# Patient Record
Sex: Female | Born: 1940 | Race: White | Hispanic: No | Smoking: Never smoker
Health system: Southern US, Community
[De-identification: ages and names within clinical notes are randomized; demographics above are authoritative.]

## PROBLEM LIST (undated history)

## (undated) DIAGNOSIS — E7800 Pure hypercholesterolemia, unspecified: Secondary | ICD-10-CM

## (undated) DIAGNOSIS — E119 Type 2 diabetes mellitus without complications: Secondary | ICD-10-CM

## (undated) DIAGNOSIS — I10 Essential (primary) hypertension: Secondary | ICD-10-CM

## (undated) HISTORY — PX: ABDOMINAL HYSTERECTOMY: SHX81

---

## 1999-09-19 ENCOUNTER — Encounter: Payer: Self-pay | Admitting: Family Medicine

## 1999-09-19 ENCOUNTER — Encounter: Admission: RE | Admit: 1999-09-19 | Discharge: 1999-09-19 | Payer: Self-pay | Admitting: Family Medicine

## 1999-10-03 ENCOUNTER — Encounter: Admission: RE | Admit: 1999-10-03 | Discharge: 1999-10-03 | Payer: Self-pay | Admitting: Family Medicine

## 1999-10-03 ENCOUNTER — Encounter: Payer: Self-pay | Admitting: Family Medicine

## 2000-04-03 ENCOUNTER — Encounter: Payer: Self-pay | Admitting: Family Medicine

## 2000-04-03 ENCOUNTER — Encounter: Admission: RE | Admit: 2000-04-03 | Discharge: 2000-04-03 | Payer: Self-pay | Admitting: Family Medicine

## 2000-04-21 ENCOUNTER — Ambulatory Visit (HOSPITAL_COMMUNITY): Admission: RE | Admit: 2000-04-21 | Discharge: 2000-04-21 | Payer: Self-pay | Admitting: Family Medicine

## 2000-04-21 ENCOUNTER — Encounter: Payer: Self-pay | Admitting: Family Medicine

## 2000-07-29 ENCOUNTER — Encounter: Admission: RE | Admit: 2000-07-29 | Discharge: 2000-07-29 | Payer: Self-pay | Admitting: Family Medicine

## 2000-07-29 ENCOUNTER — Encounter: Payer: Self-pay | Admitting: Family Medicine

## 2000-09-23 ENCOUNTER — Ambulatory Visit (HOSPITAL_COMMUNITY): Admission: RE | Admit: 2000-09-23 | Discharge: 2000-09-23 | Payer: Self-pay | Admitting: Family Medicine

## 2000-09-23 ENCOUNTER — Encounter: Payer: Self-pay | Admitting: Family Medicine

## 2001-07-15 ENCOUNTER — Ambulatory Visit (HOSPITAL_COMMUNITY): Admission: RE | Admit: 2001-07-15 | Discharge: 2001-07-15 | Payer: Self-pay | Admitting: Gastroenterology

## 2001-07-15 ENCOUNTER — Encounter: Payer: Self-pay | Admitting: Gastroenterology

## 2001-08-03 ENCOUNTER — Encounter: Admission: RE | Admit: 2001-08-03 | Discharge: 2001-08-03 | Payer: Self-pay | Admitting: Family Medicine

## 2001-08-03 ENCOUNTER — Encounter: Payer: Self-pay | Admitting: Family Medicine

## 2005-11-17 ENCOUNTER — Encounter: Admission: RE | Admit: 2005-11-17 | Discharge: 2005-11-17 | Payer: Self-pay | Admitting: Family Medicine

## 2006-07-14 ENCOUNTER — Encounter: Admission: RE | Admit: 2006-07-14 | Discharge: 2006-07-14 | Payer: Self-pay | Admitting: Family Medicine

## 2016-05-06 ENCOUNTER — Other Ambulatory Visit: Payer: Self-pay | Admitting: Family Medicine

## 2016-05-06 DIAGNOSIS — R109 Unspecified abdominal pain: Secondary | ICD-10-CM

## 2016-05-16 ENCOUNTER — Inpatient Hospital Stay (HOSPITAL_COMMUNITY)
Admission: EM | Admit: 2016-05-16 | Discharge: 2016-05-23 | DRG: 824 | Disposition: A | Discharge status: Home / Self Care | Payer: Medicare Other | Attending: Family Medicine | Admitting: Family Medicine

## 2016-05-16 ENCOUNTER — Encounter (HOSPITAL_COMMUNITY): Payer: Self-pay

## 2016-05-16 ENCOUNTER — Ambulatory Visit
Admission: RE | Admit: 2016-05-16 | Discharge: 2016-05-16 | Disposition: A | Discharge status: Home / Self Care | Payer: Medicare Other | Source: Ambulatory Visit | Attending: Family Medicine | Admitting: Family Medicine

## 2016-05-16 DIAGNOSIS — N1330 Unspecified hydronephrosis: Secondary | ICD-10-CM | POA: Diagnosis present

## 2016-05-16 DIAGNOSIS — E119 Type 2 diabetes mellitus without complications: Secondary | ICD-10-CM

## 2016-05-16 DIAGNOSIS — Z85528 Personal history of other malignant neoplasm of kidney: Secondary | ICD-10-CM | POA: Diagnosis not present

## 2016-05-16 DIAGNOSIS — Z0189 Encounter for other specified special examinations: Secondary | ICD-10-CM | POA: Diagnosis not present

## 2016-05-16 DIAGNOSIS — E871 Hypo-osmolality and hyponatremia: Secondary | ICD-10-CM | POA: Diagnosis present

## 2016-05-16 DIAGNOSIS — N390 Urinary tract infection, site not specified: Secondary | ICD-10-CM | POA: Diagnosis present

## 2016-05-16 DIAGNOSIS — Z5111 Encounter for antineoplastic chemotherapy: Secondary | ICD-10-CM | POA: Diagnosis not present

## 2016-05-16 DIAGNOSIS — Z7984 Long term (current) use of oral hypoglycemic drugs: Secondary | ICD-10-CM

## 2016-05-16 DIAGNOSIS — R160 Hepatomegaly, not elsewhere classified: Secondary | ICD-10-CM | POA: Diagnosis present

## 2016-05-16 DIAGNOSIS — E11649 Type 2 diabetes mellitus with hypoglycemia without coma: Secondary | ICD-10-CM | POA: Diagnosis present

## 2016-05-16 DIAGNOSIS — N139 Obstructive and reflux uropathy, unspecified: Secondary | ICD-10-CM | POA: Diagnosis present

## 2016-05-16 DIAGNOSIS — C8333 Diffuse large B-cell lymphoma, intra-abdominal lymph nodes: Principal | ICD-10-CM | POA: Diagnosis present

## 2016-05-16 DIAGNOSIS — E785 Hyperlipidemia, unspecified: Secondary | ICD-10-CM | POA: Diagnosis present

## 2016-05-16 DIAGNOSIS — R109 Unspecified abdominal pain: Secondary | ICD-10-CM

## 2016-05-16 DIAGNOSIS — K7460 Unspecified cirrhosis of liver: Secondary | ICD-10-CM | POA: Diagnosis present

## 2016-05-16 DIAGNOSIS — R188 Other ascites: Secondary | ICD-10-CM | POA: Diagnosis present

## 2016-05-16 DIAGNOSIS — Z6834 Body mass index (BMI) 34.0-34.9, adult: Secondary | ICD-10-CM | POA: Diagnosis not present

## 2016-05-16 DIAGNOSIS — D649 Anemia, unspecified: Secondary | ICD-10-CM | POA: Diagnosis present

## 2016-05-16 DIAGNOSIS — Z7982 Long term (current) use of aspirin: Secondary | ICD-10-CM

## 2016-05-16 DIAGNOSIS — E7800 Pure hypercholesterolemia, unspecified: Secondary | ICD-10-CM | POA: Diagnosis present

## 2016-05-16 DIAGNOSIS — Z79899 Other long term (current) drug therapy: Secondary | ICD-10-CM

## 2016-05-16 DIAGNOSIS — I700 Atherosclerosis of aorta: Secondary | ICD-10-CM | POA: Diagnosis present

## 2016-05-16 DIAGNOSIS — R590 Localized enlarged lymph nodes: Secondary | ICD-10-CM | POA: Diagnosis present

## 2016-05-16 DIAGNOSIS — T68XXXA Hypothermia, initial encounter: Secondary | ICD-10-CM

## 2016-05-16 DIAGNOSIS — R591 Generalized enlarged lymph nodes: Secondary | ICD-10-CM

## 2016-05-16 DIAGNOSIS — N131 Hydronephrosis with ureteral stricture, not elsewhere classified: Secondary | ICD-10-CM | POA: Diagnosis present

## 2016-05-16 DIAGNOSIS — E860 Dehydration: Secondary | ICD-10-CM | POA: Diagnosis present

## 2016-05-16 DIAGNOSIS — N179 Acute kidney failure, unspecified: Secondary | ICD-10-CM | POA: Diagnosis present

## 2016-05-16 DIAGNOSIS — C8330 Diffuse large B-cell lymphoma, unspecified site: Secondary | ICD-10-CM | POA: Diagnosis not present

## 2016-05-16 DIAGNOSIS — K769 Liver disease, unspecified: Secondary | ICD-10-CM | POA: Diagnosis not present

## 2016-05-16 DIAGNOSIS — C787 Secondary malignant neoplasm of liver and intrahepatic bile duct: Secondary | ICD-10-CM

## 2016-05-16 DIAGNOSIS — R410 Disorientation, unspecified: Secondary | ICD-10-CM

## 2016-05-16 DIAGNOSIS — I10 Essential (primary) hypertension: Secondary | ICD-10-CM | POA: Diagnosis present

## 2016-05-16 DIAGNOSIS — R599 Enlarged lymph nodes, unspecified: Secondary | ICD-10-CM

## 2016-05-16 DIAGNOSIS — E872 Acidosis: Secondary | ICD-10-CM | POA: Diagnosis present

## 2016-05-16 DIAGNOSIS — E669 Obesity, unspecified: Secondary | ICD-10-CM | POA: Diagnosis present

## 2016-05-16 DIAGNOSIS — C8580 Other specified types of non-Hodgkin lymphoma, unspecified site: Secondary | ICD-10-CM

## 2016-05-16 DIAGNOSIS — R0602 Shortness of breath: Secondary | ICD-10-CM

## 2016-05-16 DIAGNOSIS — E162 Hypoglycemia, unspecified: Secondary | ICD-10-CM | POA: Diagnosis present

## 2016-05-16 DIAGNOSIS — B3749 Other urogenital candidiasis: Secondary | ICD-10-CM | POA: Insufficient documentation

## 2016-05-16 HISTORY — DX: Pure hypercholesterolemia, unspecified: E78.00

## 2016-05-16 HISTORY — DX: Type 2 diabetes mellitus without complications: E11.9

## 2016-05-16 HISTORY — DX: Essential (primary) hypertension: I10

## 2016-05-16 LAB — BASIC METABOLIC PANEL
Anion gap: 8 (ref 5–15)
BUN: 52 mg/dL — ABNORMAL HIGH (ref 6–20)
CALCIUM: 14.1 mg/dL — AB (ref 8.9–10.3)
CO2: 24 mmol/L (ref 22–32)
CREATININE: 1.71 mg/dL — AB (ref 0.44–1.00)
Chloride: 102 mmol/L (ref 101–111)
GFR, EST AFRICAN AMERICAN: 33 mL/min — AB (ref >60)
GFR, EST NON AFRICAN AMERICAN: 28 mL/min — AB (ref >60)
Glucose, Bld: 86 mg/dL (ref 65–99)
Potassium: 4.1 mmol/L (ref 3.5–5.1)
Sodium: 134 mmol/L — ABNORMAL LOW (ref 135–145)

## 2016-05-16 LAB — GLUCOSE, CAPILLARY: GLUCOSE-CAPILLARY: 104 mg/dL — AB (ref 65–99)

## 2016-05-16 LAB — COMPREHENSIVE METABOLIC PANEL
ALT: 15 U/L (ref 14–54)
AST: 34 U/L (ref 15–41)
Albumin: 3.6 g/dL (ref 3.5–5.0)
Alkaline Phosphatase: 67 U/L (ref 38–126)
Anion gap: 14 (ref 5–15)
BUN: 53 mg/dL — AB (ref 6–20)
CHLORIDE: 100 mmol/L — AB (ref 101–111)
CO2: 20 mmol/L — AB (ref 22–32)
CREATININE: 1.56 mg/dL — AB (ref 0.44–1.00)
Calcium: 14.5 mg/dL (ref 8.9–10.3)
GFR calc Af Amer: 36 mL/min — ABNORMAL LOW (ref >60)
GFR, EST NON AFRICAN AMERICAN: 31 mL/min — AB (ref >60)
Glucose, Bld: 34 mg/dL — CL (ref 65–99)
POTASSIUM: 3.9 mmol/L (ref 3.5–5.1)
SODIUM: 134 mmol/L — AB (ref 135–145)
Total Bilirubin: 0.9 mg/dL (ref 0.3–1.2)
Total Protein: 7.4 g/dL (ref 6.5–8.1)

## 2016-05-16 LAB — CBC WITH DIFFERENTIAL/PLATELET
BASOS ABS: 0 10*3/uL (ref 0.0–0.1)
BASOS PCT: 0 %
Eosinophils Absolute: 0.1 10*3/uL (ref 0.0–0.7)
Eosinophils Relative: 1 %
HEMATOCRIT: 38.2 % (ref 36.0–46.0)
HEMOGLOBIN: 12.6 g/dL (ref 12.0–15.0)
LYMPHS PCT: 15 %
Lymphs Abs: 1.5 10*3/uL (ref 0.7–4.0)
MCH: 27.7 pg (ref 26.0–34.0)
MCHC: 33 g/dL (ref 30.0–36.0)
MCV: 84 fL (ref 78.0–100.0)
MONOS PCT: 11 %
Monocytes Absolute: 1.1 10*3/uL — ABNORMAL HIGH (ref 0.1–1.0)
NEUTROS ABS: 7.3 10*3/uL (ref 1.7–7.7)
NEUTROS PCT: 73 %
Platelets: 365 10*3/uL (ref 150–400)
RBC: 4.55 MIL/uL (ref 3.87–5.11)
RDW: 14 % (ref 11.5–15.5)
WBC: 10.1 10*3/uL (ref 4.0–10.5)

## 2016-05-16 LAB — URINALYSIS, ROUTINE W REFLEX MICROSCOPIC
Bilirubin Urine: NEGATIVE
Glucose, UA: NEGATIVE mg/dL
Ketones, ur: NEGATIVE mg/dL
Nitrite: NEGATIVE
PROTEIN: NEGATIVE mg/dL
SPECIFIC GRAVITY, URINE: 1.046 — AB (ref 1.005–1.030)
pH: 5 (ref 5.0–8.0)

## 2016-05-16 LAB — CBG MONITORING, ED
GLUCOSE-CAPILLARY: 120 mg/dL — AB (ref 65–99)
GLUCOSE-CAPILLARY: 149 mg/dL — AB (ref 65–99)
Glucose-Capillary: 26 mg/dL — CL (ref 65–99)

## 2016-05-16 LAB — I-STAT CG4 LACTIC ACID, ED: LACTIC ACID, VENOUS: 2.51 mmol/L — AB (ref 0.5–1.9)

## 2016-05-16 LAB — URINE MICROSCOPIC-ADD ON

## 2016-05-16 LAB — AMMONIA: AMMONIA: 46 umol/L — AB (ref 9–35)

## 2016-05-16 MED ORDER — DEXTROSE 50 % IV SOLN
INTRAVENOUS | Status: AC
Start: 2016-05-16 — End: 2016-05-16
  Administered 2016-05-16: 50 mL
  Filled 2016-05-16: qty 50

## 2016-05-16 MED ORDER — IOPAMIDOL (ISOVUE-300) INJECTION 61%
INTRAVENOUS | Status: AC
  Filled 2016-05-16: qty 75

## 2016-05-16 MED ORDER — SODIUM CHLORIDE 0.9 % IV SOLN
90.0000 mg | Freq: Once | INTRAVENOUS | Status: AC
  Administered 2016-05-17: 90 mg via INTRAVENOUS
  Filled 2016-05-16: qty 10

## 2016-05-16 MED ORDER — ENSURE ENLIVE PO LIQD
237.0000 mL | Freq: Two times a day (BID) | ORAL | Status: DC
  Administered 2016-05-18 – 2016-05-20 (×3): 237 mL via ORAL

## 2016-05-16 MED ORDER — KCL IN DEXTROSE-NACL 20-5-0.9 MEQ/L-%-% IV SOLN
INTRAVENOUS | Status: DC
  Administered 2016-05-17 – 2016-05-18 (×4): via INTRAVENOUS
  Filled 2016-05-16 (×5): qty 1000

## 2016-05-16 MED ORDER — CEFTRIAXONE SODIUM 1 G IJ SOLR
1.0000 g | INTRAMUSCULAR | Status: DC
  Administered 2016-05-17 – 2016-05-22 (×6): 1 g via INTRAVENOUS
  Filled 2016-05-16 (×8): qty 10

## 2016-05-16 MED ORDER — ATENOLOL 50 MG PO TABS
100.0000 mg | ORAL_TABLET | Freq: Every evening | ORAL | Status: DC
  Administered 2016-05-16 – 2016-05-22 (×6): 100 mg via ORAL
  Filled 2016-05-16: qty 2
  Filled 2016-05-16: qty 4
  Filled 2016-05-16: qty 2
  Filled 2016-05-16 (×2): qty 4
  Filled 2016-05-16: qty 2

## 2016-05-16 MED ORDER — SODIUM CHLORIDE 0.9% FLUSH
3.0000 mL | Freq: Two times a day (BID) | INTRAVENOUS | Status: DC
  Administered 2016-05-17 – 2016-05-23 (×12): 3 mL via INTRAVENOUS

## 2016-05-16 MED ORDER — ONDANSETRON HCL 4 MG PO TABS: 4.0000 mg | ORAL_TABLET | Freq: Four times a day (QID) | ORAL | Status: DC | PRN

## 2016-05-16 MED ORDER — ZOLEDRONIC ACID 5 MG/100ML IV SOLN: 5.0000 mg | Freq: Once | INTRAVENOUS | Status: DC

## 2016-05-16 MED ORDER — CALCITONIN (SALMON) 200 UNIT/ML IJ SOLN
200.0000 [IU] | Freq: Once | INTRAMUSCULAR | Status: AC
  Administered 2016-05-16: 200 [IU] via INTRAMUSCULAR
  Filled 2016-05-16 (×2): qty 1

## 2016-05-16 MED ORDER — ASPIRIN EC 81 MG PO TBEC
81.0000 mg | DELAYED_RELEASE_TABLET | Freq: Every day | ORAL | Status: DC
Start: 2016-05-17 — End: 2016-05-23
  Administered 2016-05-17 – 2016-05-23 (×6): 81 mg via ORAL
  Filled 2016-05-16 (×7): qty 1

## 2016-05-16 MED ORDER — SODIUM CHLORIDE 0.9 % IV BOLUS (SEPSIS)
1000.0000 mL | Freq: Once | INTRAVENOUS | Status: AC
  Administered 2016-05-17: 1000 mL via INTRAVENOUS

## 2016-05-16 MED ORDER — ONDANSETRON HCL 4 MG/2ML IJ SOLN
4.0000 mg | Freq: Four times a day (QID) | INTRAMUSCULAR | Status: DC | PRN
  Administered 2016-05-16 – 2016-05-23 (×3): 4 mg via INTRAVENOUS
  Filled 2016-05-16 (×4): qty 2

## 2016-05-16 MED ORDER — DEXTROSE 5 % IV SOLN
1.0000 g | Freq: Once | INTRAVENOUS | Status: AC
  Administered 2016-05-16: 1 g via INTRAVENOUS
  Filled 2016-05-16: qty 10

## 2016-05-16 MED ORDER — POTASSIUM CHLORIDE IN NACL 20-0.9 MEQ/L-% IV SOLN: INTRAVENOUS | Status: DC

## 2016-05-16 MED ORDER — POTASSIUM CL IN DEXTROSE 5% 20 MEQ/L IV SOLN
20.0000 meq | INTRAVENOUS | Status: DC
  Filled 2016-05-16: qty 1000

## 2016-05-16 MED ORDER — FUROSEMIDE 10 MG/ML IJ SOLN
20.0000 mg | Freq: Once | INTRAMUSCULAR | Status: AC
  Administered 2016-05-17: 20 mg via INTRAVENOUS
  Filled 2016-05-16: qty 2

## 2016-05-16 MED ORDER — HEPARIN SODIUM (PORCINE) 5000 UNIT/ML IJ SOLN
5000.0000 [IU] | Freq: Three times a day (TID) | INTRAMUSCULAR | Status: DC
  Administered 2016-05-16 – 2016-05-18 (×6): 5000 [IU] via SUBCUTANEOUS
  Filled 2016-05-16 (×4): qty 1

## 2016-05-16 MED ORDER — IOPAMIDOL (ISOVUE-300) INJECTION 61%
100.0000 mL | Freq: Once | INTRAVENOUS | Status: AC | PRN
  Administered 2016-05-16: 100 mL via INTRAVENOUS

## 2016-05-16 NOTE — ED Provider Notes (Signed)
[facility] DEPT Provider Note   CSN: [id number] Arrival date & time: 05/16/16  1434     History   Chief Complaint Chief Complaint  Patient presents with  . Flank Pain    HPI Grace Patel is a 75 y.o. female.  Patient presents to the ED with a chief complaint confusion.  She is brought in by her daughter, who states that she has been acting confused today.  She also reports that she has had lower abdominal pain, nausea, vomiting, diarrhea, and weakness for the past 10 days. She reports that she has not been eating or drinking well. She is having difficulty keeping fluids down. She reports being seen by her primary care doctor and had a CT scan yesterday which showed extensive adenopathy worrisome for metastatic disease, ascites, and obstructive uropathy.  There are no other associated symptoms.  She denies fever, but does endorse chills.  She denies any chest pain, SOB, or cough.  There are no modifying factors.   The history is provided by the patient. No language interpreter was used.    Past Medical History:  Diagnosis Date  . Diabetes mellitus without complication ([place])   . Hypercholesteremia   . Hypertension     There are no active problems to display for this patient.   Past Surgical History:  Procedure Laterality Date  . ABDOMINAL HYSTERECTOMY      OB History    No data available       Home Medications    Prior to Admission medications   Not on File    Family History No family history on file.  Social History Social History  Substance Use Topics  . Smoking status: Never Smoker  . Smokeless tobacco: Never Used  . Alcohol use No     Allergies   Review of patient's allergies indicates no known allergies.   Review of Systems Review of Systems  Unable to perform ROS: Mental status change     Physical Exam Updated Vital Signs BP (!) 147/52   Pulse 73   Temp (!) 95.1 F (35.1 C) (Rectal)   Resp 23   SpO2 100%   Physical Exam    Constitutional: She is oriented to person, place, and time. She appears well-developed and well-nourished.  HENT:  Head: Normocephalic and atraumatic.  Eyes: Conjunctivae and EOM are normal. Pupils are equal, round, and reactive to light.  Neck: Normal range of motion. Neck supple.  Cardiovascular: Normal rate and regular rhythm.  Exam reveals no gallop and no friction rub.   No murmur heard. Pulmonary/Chest: Effort normal and breath sounds normal. No respiratory distress. She has no wheezes. She has no rales. She exhibits no tenderness.  Abdominal: Soft. Bowel sounds are normal. She exhibits no distension and no mass. There is no tenderness. There is no rebound and no guarding.  Musculoskeletal: Normal range of motion. She exhibits no edema or tenderness.  Neurological: She is alert and oriented to person, place, and time.  Skin: Skin is warm and dry.  Psychiatric: She has a normal mood and affect. Her behavior is normal. Judgment and thought content normal.  Nursing note and vitals reviewed.    ED Treatments / Results  Labs (all labs ordered are listed, but only abnormal results are displayed) Labs Reviewed  COMPREHENSIVE METABOLIC PANEL - Abnormal; Notable for the following:       Result Value   Sodium 134 (*)    Chloride 100 (*)    CO2 20 (*)  Glucose, Bld 34 (*)    BUN 53 (*)    Creatinine, Ser 1.56 (*)    Calcium 14.5 (*)    GFR calc non Af Amer 31 (*)    GFR calc Af Amer 36 (*)    All other components within normal limits  CBC WITH DIFFERENTIAL/PLATELET - Abnormal; Notable for the following:    Monocytes Absolute 1.1 (*)    All other components within normal limits  I-STAT CG4 LACTIC ACID, ED - Abnormal; Notable for the following:    Lactic Acid, Venous 2.51 (*)    All other components within normal limits  CBG MONITORING, ED - Abnormal; Notable for the following:    Glucose-Capillary 26 (*)    All other components within normal limits  URINE CULTURE  CULTURE,  BLOOD (ROUTINE X 2)  CULTURE, BLOOD (ROUTINE X 2)  URINALYSIS, ROUTINE W REFLEX MICROSCOPIC (NOT AT [facility])  CBG MONITORING, ED  CBG MONITORING, ED    EKG  EKG Interpretation None       Radiology Ct Abdomen W Contrast  Result Date: 05/16/2016 CLINICAL DATA:  Nausea and vomiting for several weeks. EXAM: CT ABDOMEN WITH CONTRAST TECHNIQUE: Multidetector CT imaging of the abdomen was performed using the standard protocol following bolus administration of intravenous contrast. CONTRAST:  145mL ISOVUE-300 IOPAMIDOL (ISOVUE-300) INJECTION 61% COMPARISON:  None. FINDINGS: Lower chest: There is a small left pleural effusion identified. The lung bases appear clear. Hepatobiliary: The liver appears cirrhotic. The contour the liver is diffusely nodular and there is hypertrophy of the caudate lobe lateral segment of left lobe. Indeterminate structure in the right lobe of liver measures 3.5 x 1.6 cm and 46 HU. The gallbladder appears normal. No biliary dilatation. Pancreas: No inflammation or mass identified. Spleen: The spleen appears normal. The spleen measures 10 cm in length. Adrenals/Urinary Tract: Normal appearance of the adrenal glands. The kidneys half an irregular contour bilaterally suggestive of chronic scarring. There is left-sided hydronephrosis. Stomach/Bowel: The stomach appears normal. The small bowel loops have a normal course and caliber. No pathologic dilatation of the colon. Vascular/Lymphatic: Aortic atherosclerosis is noted. No aneurysm. Extensive retroperitoneal adenopathy is identified. Nodal mass within the retroperitoneum measures 9.3 x 5.0 cm, image 35 of series 3. There are numerous soft tissue nodules identified within the left-sided perinephric space. Other: Mild soft tissue nodularity within the perineum is identified along with ascites. In the setting of cirrhosis the ascites is nonspecific. Musculoskeletal: No aggressive lytic or sclerotic bone lesions. IMPRESSION: 1. Extensive  adenopathy is identified within the upper abdomen which is worrisome for either a metastatic adenopathy versus lymphoproliferative disorder such as lymphoma. Further assessment with PET-CT and tissue sampling is recommended. 2. There is evidence of soft tissue nodularity within the peritoneum as well as within the left perinephric fat. 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 Ascites 4. Left-sided obstructive uropathy. Favor obstruction of the left ureter secondary to retroperitoneal adenopathy. 5. Morphologic features of the liver compatible with cirrhosis. 6. Indeterminate intermediate attenuating structure within right lobe of liver. A more definitive assessment of this structure could be obtained with a contrast enhanced MR of the liver. 7. Aortic atherosclerosis.  No aneurysm. Electronically Signed   By: Kerby Moors M.D.   On: 05/16/2016 13:23    Procedures Procedures (including critical care time)  Medications Ordered in ED Medications  cefTRIAXone (ROCEPHIN) 1 g in dextrose 5 % 50 mL IVPB (not administered)  dextrose 50 % solution (50 mLs  Given 05/16/16 1608)  Initial Impression / Assessment and Plan / ED Course  I have reviewed the triage vital signs and the nursing notes.  Pertinent labs & imaging results that were available during my care of the patient were reviewed by me and considered in my medical decision making (see chart for details).  Clinical Course    Patient brought to the emergency department by her daughter with chief complaint of confusion. Patient had a CT scan performed yesterday which showed extensive abdominal adenopathy. Pelvis showed a ascites and obstructive uropathy. Patient noted to be quite hypoglycemic today to 26, she was alert, and one amp of D50 was given which corrected the glucose to the 120s. The glucose was rechecked several times, and she has not had any further episodes of hypoglycemia. Laboratory workup is also remarkable for  hypercalcemia at 14.5. She does not have leukocytosis. She was moderately hypothermic on arrival today, but this has resolved after being treated with the [facility].  Patient seen by and discussed with Dr. Roderic Palau, who agrees with plan for admission to medicine.  Appreciate Dr. Olevia Bowens for admitting the patient.  Final Clinical Impressions(s) / ED Diagnoses   Final diagnoses:  Hypercalcemia  Confusion  Hypoglycemia    New Prescriptions New Prescriptions   No medications on file     Montine Circle, Hershal Coria 05/16/16 Curly Rim    Milton Ferguson, MD 05/17/16 1226

## 2016-05-16 NOTE — ED Notes (Signed)
Report attempted x 1

## 2016-05-16 NOTE — ED Notes (Signed)
MD Ortiz at bedside 

## 2016-05-16 NOTE — ED Triage Notes (Signed)
Pt presents from PCP office following CT scan today. Sent here for evaluation of L kidney blockage and need for stent. Pt. States flank pain ongoing x 2-3 weeks, seen by PCP approx 10 days ago and scheduled for CT today. Pt. Family reports frequent diarrhea and vomiting.

## 2016-05-16 NOTE — H&P (Signed)
History and Physical    TALAYEH NESTLE [id number] DOB: October 18, 1940 DOA: 05/16/2016  PCP: No primary care provider on file.   Patient coming from: Referred by her PCP  Chief Complaint: Nausea and vomiting.  HPI: Grace Patel is a 75 y.o. female with medical history significant of type 2 diabetes, hyperlipidemia, hypertension who comes to the ED referred by her PCP due to several weeks of nausea/vomiting, flank pain and ct scan abdomen/pelvis showing left hydronephrosis, multiple adenopathies and liver lesion suspicious for malignancy.   Per patient, she has been having nausea, emesis, diarrhea fatigue and malaise in her.  She denies fever, chills, but complains of night sweats, the past few days. She denies abdominal pain, melena or hematochezia. She denies GU symptoms. She denies chest pain, palpitations, dyspnea, pitting edema of the lower extremities.   In the ER, the patient was initially hypoglycemic, which has resolved with dextrose IV. She was initially hypothermic, but her temperature is normal now. She is in no acute distress.   ED Course: The patient received IV dextrose, IV antibiotics and warming blanket reporting relief. Workup shows mildly abnormal urinalysis,  lactic acid of 2.51 mmol/L, hypercalcemia of 14.5 mg/dL, BUN 53 mg/dL, creatinine 1.56 mg/dL. Normal white blood cell count and hemoglobin.  Review of Systems: As per HPI otherwise 10 point review of systems negative.     Past Medical History:  Diagnosis Date  . Diabetes mellitus without complication ([place])   . Hypercholesteremia   . Hypertension     Past Surgical History:  Procedure Laterality Date  . ABDOMINAL HYSTERECTOMY       reports that she has never smoked. She has never used smokeless tobacco. She reports that she does not drink alcohol or use drugs.  Allergies  Allergen Reactions  . Codeine     Makes me sick     Family History  Problem Relation Age of Onset  . Heart attack Father   .  Heart disease Sister   . Lung disease Brother     Asbestosis  . Heart attack Brother      Prior to Admission medications   Medication Sig Start Date End Date Taking? Authorizing Provider  aspirin EC 81 MG tablet Take 81 mg by mouth daily.   Yes Historical Provider, MD  atenolol (TENORMIN) 100 MG tablet Take 100 mg by mouth every evening.   Yes Historical Provider, MD  glimepiride (AMARYL) 4 MG tablet Take 4 mg by mouth daily with breakfast.   Yes Historical Provider, MD  metFORMIN (GLUCOPHAGE) 500 MG tablet Take 1,000 mg by mouth 2 (two) times daily with a meal.   Yes Historical Provider, MD  pioglitazone (ACTOS) 30 MG tablet Take 30 mg by mouth daily.   Yes Historical Provider, MD  rosuvastatin (CRESTOR) 20 MG tablet Take 10 mg by mouth See admin instructions. Take on Mondays Wednesday and Friday mornings   Yes Historical Provider, MD  valsartan-hydrochlorothiazide (DIOVAN-HCT) 320-25 MG tablet Take 1 tablet by mouth daily.   Yes Historical Provider, MD    Physical Exam: Vitals:   05/16/16 1935 05/16/16 1945 05/16/16 2015 05/16/16 2124  BP:  (!) 106/54 (!) 123/53 (!) 126/57  Pulse:  72 75 79  Resp:  22  20  Temp: 98.3 F (36.8 C)   97.5 F (36.4 C)  TempSrc: Rectal   Oral  SpO2:  98% 98% 97%  Weight:    79.5 kg (175 lb 4.3 oz)  Height:    4' 11.5" (  1.511 m)      Constitutional: Looks pale, ill  Vitals:   05/16/16 1935 05/16/16 1945 05/16/16 2015 05/16/16 2124  BP:  (!) 106/54 (!) 123/53 (!) 126/57  Pulse:  72 75 79  Resp:  22  20  Temp: 98.3 F (36.8 C)   97.5 F (36.4 C)  TempSrc: Rectal   Oral  SpO2:  98% 98% 97%  Weight:    79.5 kg (175 lb 4.3 oz)  Height:    4' 11.5" (1.511 m)   Eyes: PERRL, lids and conjunctivae normal ENMT: Mucous membranes are dry. Posterior pharynx clear of any exudate or lesions.  Neck: normal, supple, no masses, no thyromegaly Respiratory: clear to auscultation bilaterally, no wheezing, no crackles. Normal respiratory effort. No  accessory muscle use.  Cardiovascular: Regular rate and rhythm, no murmurs / rubs / gallops. No extremity edema. 2+ pedal pulses. No carotid bruits.  Abdomen: Bowel sounds positive. Soft, no tenderness, no masses palpated. No hepatosplenomegaly.  No CVA tenderness at this time. Musculoskeletal: no clubbing / cyanosis. No joint deformity upper and lower extremities. Good ROM, no contractures. Normal muscle tone.  Skin: erythematous rash just below right breast. Neurologic: CN 2-12 grossly intact. Sensation intact, DTR normal. Strength 5/5 in all 4.  Psychiatric: Normal judgment and insight. Alert and oriented x 4. Normal mood.     Labs on Admission: I have personally reviewed following labs and imaging studies  CBC:  Recent Labs Lab 05/16/16 1446  WBC 10.1  NEUTROABS 7.3  HGB 12.6  HCT 38.2  MCV 84.0  PLT 99991111   Basic Metabolic Panel:  Recent Labs Lab 05/16/16 1446  NA 134*  K 3.9  CL 100*  CO2 20*  GLUCOSE 34*  BUN 53*  CREATININE 1.56*  CALCIUM 14.5*   GFR: Estimated Creatinine Clearance: 28.7 mL/min (by C-G formula based on SCr of 1.56 mg/dL). Liver Function Tests:  Recent Labs Lab 05/16/16 1446  AST 34  ALT 15  ALKPHOS 67  BILITOT 0.9  PROT 7.4  ALBUMIN 3.6     Recent Labs Lab 05/16/16 1740  AMMONIA 46*    CBG:  Recent Labs Lab 05/16/16 1558 05/16/16 1642 05/16/16 1742 05/16/16 2122  GLUCAP 26* 120* 149* 104*    Urine analysis:    Component Value Date/Time   COLORURINE YELLOW 05/16/2016 [place] 05/16/2016 1905   LABSPEC 1.046 (H) 05/16/2016 1905   PHURINE 5.0 05/16/2016 1905   GLUCOSEU NEGATIVE 05/16/2016 1905   HGBUR MODERATE (A) 05/16/2016 1905   BILIRUBINUR NEGATIVE 05/16/2016 1905   KETONESUR NEGATIVE 05/16/2016 1905   PROTEINUR NEGATIVE 05/16/2016 1905   NITRITE NEGATIVE 05/16/2016 1905   LEUKOCYTESUR SMALL (A) 05/16/2016 1905     Radiological Exams on Admission: Ct Abdomen W Contrast  Result Date:  05/16/2016 CLINICAL DATA:  Nausea and vomiting for several weeks. EXAM: CT ABDOMEN WITH CONTRAST TECHNIQUE: Multidetector CT imaging of the abdomen was performed using the standard protocol following bolus administration of intravenous contrast. CONTRAST:  127mL ISOVUE-300 IOPAMIDOL (ISOVUE-300) INJECTION 61% COMPARISON:  None. FINDINGS: Lower chest: There is a small left pleural effusion identified. The lung bases appear clear. Hepatobiliary: The liver appears cirrhotic. The contour the liver is diffusely nodular and there is hypertrophy of the caudate lobe lateral segment of left lobe. Indeterminate structure in the right lobe of liver measures 3.5 x 1.6 cm and 46 HU. The gallbladder appears normal. No biliary dilatation. Pancreas: No inflammation or mass identified. Spleen: The spleen appears normal.  The spleen measures 10 cm in length. Adrenals/Urinary Tract: Normal appearance of the adrenal glands. The kidneys half an irregular contour bilaterally suggestive of chronic scarring. There is left-sided hydronephrosis. Stomach/Bowel: The stomach appears normal. The small bowel loops have a normal course and caliber. No pathologic dilatation of the colon. Vascular/Lymphatic: Aortic atherosclerosis is noted. No aneurysm. Extensive retroperitoneal adenopathy is identified. Nodal mass within the retroperitoneum measures 9.3 x 5.0 cm, image 35 of series 3. There are numerous soft tissue nodules identified within the left-sided perinephric space. Other: Mild soft tissue nodularity within the perineum is identified along with ascites. In the setting of cirrhosis the ascites is nonspecific. Musculoskeletal: No aggressive lytic or sclerotic bone lesions. IMPRESSION: 1. Extensive adenopathy is identified within the upper abdomen which is worrisome for either a metastatic adenopathy versus lymphoproliferative disorder such as lymphoma. Further assessment with PET-CT and tissue sampling is recommended. 2. There is evidence of  soft tissue nodularity within the peritoneum as well as within the left perinephric fat. 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 Ascites 4. Left-sided obstructive uropathy. Favor obstruction of the left ureter secondary to retroperitoneal adenopathy. 5. Morphologic features of the liver compatible with cirrhosis. 6. Indeterminate intermediate attenuating structure within right lobe of liver. A more definitive assessment of this structure could be obtained with a contrast enhanced MR of the liver. 7. Aortic atherosclerosis.  No aneurysm. Electronically Signed   By: Kerby Moors M.D.   On: 05/16/2016 13:23    EKG: Independently reviewed. Vent. rate 69 BPM PR interval * ms QRS duration 77 ms QT/QTc 510/547 ms P-R-T axes 19 47 6 Sinus rhythm Abnormal R-wave progression, early transition Borderline T abnormalities, diffuse leads Prolonged QT interval  Assessment/Plan Principal Problem:   Hypercalcemia Admit to telemetry/inpatient. Normal saline 1 L bolus 1. Continue normal saline infusion. Furosemide 20 mg IVP 1. Pamidronate 90 mg IVPB 1. Calcitonin 200 units IVPB 1 Follow-up calcium level.  Active Problems:   Type 2 diabetes mellitus (HCC)   Hypoglycemia Carbohydrate modified diet. Hold oral hypoglycemic medications for now. CBG monitoring 4 times a day before meals and at bedtime. CBG monitoring when necessary. Dextrose as needed.    Hypothermia Resolved with warming blankets. Monitor temperature closely.    Liver mass, right lobe whole body PET scan ordered. Discussed with interventional radiology who will evaluate the patient tomorrow. Procedure will be likely done on Monday.    Acute unilateral obstructive uropathy   Hydronephrosis of left kidney Discussed with urology who will evaluate the patient in the morning.    UTI (urinary tract infection) Afebrile with no significant GU symptoms. Continue ceftriaxone 1 g every 24 hours. Follow-up urine  culture and sensitivity.    Hyponatremia Likely due to GI losses. Continue normal saline infusion.  Follow-up sodium level. Check CT scan of chest given hypocalcemia and hyponatremia.   Hyperlipidemia Will hold Crestor for now given generalized weakness. Monitor LFTs periodically.      Hypertension Hold valsartan HCTZ due to acidemia and hypercalcemia. Continue atenolol 100 mg by mouth daily. Monitor blood pressure periodically.    DVT prophylaxis: Heparin SQ Code Status: Full code. Family Communication: her two daughters and grandson were present in the ED room. Disposition Plan: Admit for hypercalcemia treatment, urology and IR evaluation. Consults called: Interventional radiology Arne Cleveland, M.D.), urology Baruch Gouty, MD) Admission status: Inpatient/telemetry   Reubin Milan MD [facility] Pager [phone].  If 7PM-7AM, please contact night-coverage [email or web address] Password [facility]  05/16/2016, 9:25 PM

## 2016-05-16 NOTE — ED Notes (Signed)
Patient is stable and able to taken to 6E at this time.  Temperature is stable and glucose is within normal.  Patient is A&Ox4.  Patient will be taken via telemetry to [place] by EMT

## 2016-05-17 ENCOUNTER — Inpatient Hospital Stay (HOSPITAL_COMMUNITY): Payer: Medicare Other

## 2016-05-17 DIAGNOSIS — D649 Anemia, unspecified: Secondary | ICD-10-CM

## 2016-05-17 DIAGNOSIS — K769 Liver disease, unspecified: Secondary | ICD-10-CM

## 2016-05-17 DIAGNOSIS — R599 Enlarged lymph nodes, unspecified: Secondary | ICD-10-CM

## 2016-05-17 DIAGNOSIS — E119 Type 2 diabetes mellitus without complications: Secondary | ICD-10-CM

## 2016-05-17 DIAGNOSIS — Z85528 Personal history of other malignant neoplasm of kidney: Secondary | ICD-10-CM

## 2016-05-17 LAB — CBC WITH DIFFERENTIAL/PLATELET
BASOS PCT: 0 %
Basophils Absolute: 0 10*3/uL (ref 0.0–0.1)
EOS ABS: 0.1 10*3/uL (ref 0.0–0.7)
EOS PCT: 1 %
HCT: 32.7 % — ABNORMAL LOW (ref 36.0–46.0)
HEMOGLOBIN: 10.4 g/dL — AB (ref 12.0–15.0)
LYMPHS ABS: 0.6 10*3/uL — AB (ref 0.7–4.0)
Lymphocytes Relative: 10 %
MCH: 26.9 pg (ref 26.0–34.0)
MCHC: 31.8 g/dL (ref 30.0–36.0)
MCV: 84.5 fL (ref 78.0–100.0)
Monocytes Absolute: 0.8 10*3/uL (ref 0.1–1.0)
Monocytes Relative: 13 %
NEUTROS PCT: 76 %
Neutro Abs: 4.2 10*3/uL (ref 1.7–7.7)
PLATELETS: 231 10*3/uL (ref 150–400)
RBC: 3.87 MIL/uL (ref 3.87–5.11)
RDW: 14.2 % (ref 11.5–15.5)
WBC: 5.6 10*3/uL (ref 4.0–10.5)

## 2016-05-17 LAB — COMPREHENSIVE METABOLIC PANEL
ALBUMIN: 2.7 g/dL — AB (ref 3.5–5.0)
ALT: 13 U/L — AB (ref 14–54)
ANION GAP: 8 (ref 5–15)
AST: 43 U/L — ABNORMAL HIGH (ref 15–41)
Alkaline Phosphatase: 54 U/L (ref 38–126)
BUN: 42 mg/dL — ABNORMAL HIGH (ref 6–20)
CHLORIDE: 105 mmol/L (ref 101–111)
CO2: 25 mmol/L (ref 22–32)
Calcium: 12.3 mg/dL — ABNORMAL HIGH (ref 8.9–10.3)
Creatinine, Ser: 1.58 mg/dL — ABNORMAL HIGH (ref 0.44–1.00)
GFR calc non Af Amer: 31 mL/min — ABNORMAL LOW (ref >60)
GFR, EST AFRICAN AMERICAN: 36 mL/min — AB (ref >60)
GLUCOSE: 134 mg/dL — AB (ref 65–99)
Potassium: 4.3 mmol/L (ref 3.5–5.1)
SODIUM: 138 mmol/L (ref 135–145)
Total Bilirubin: 0.5 mg/dL (ref 0.3–1.2)
Total Protein: 6 g/dL — ABNORMAL LOW (ref 6.5–8.1)

## 2016-05-17 LAB — LACTATE DEHYDROGENASE: LDH: 146 U/L (ref 98–192)

## 2016-05-17 LAB — GLUCOSE, CAPILLARY
GLUCOSE-CAPILLARY: 139 mg/dL — AB (ref 65–99)
Glucose-Capillary: 203 mg/dL — ABNORMAL HIGH (ref 65–99)
Glucose-Capillary: 125 mg/dL — ABNORMAL HIGH (ref 65–99)
Glucose-Capillary: 184 mg/dL — ABNORMAL HIGH (ref 65–99)

## 2016-05-17 LAB — LACTIC ACID, PLASMA
LACTIC ACID, VENOUS: 1.8 mmol/L (ref 0.5–1.9)
Lactic Acid, Venous: 3.2 mmol/L (ref 0.5–1.9)

## 2016-05-17 LAB — PROTIME-INR
INR: 1.3
PROTHROMBIN TIME: 16.3 s — AB (ref 11.4–15.2)

## 2016-05-17 MED ORDER — FUROSEMIDE 40 MG PO TABS
40.0000 mg | ORAL_TABLET | Freq: Every day | ORAL | Status: DC
  Filled 2016-05-17: qty 1

## 2016-05-17 MED ORDER — INSULIN ASPART 100 UNIT/ML ~~LOC~~ SOLN
0.0000 [IU] | Freq: Three times a day (TID) | SUBCUTANEOUS | Status: DC
  Administered 2016-05-19 – 2016-05-23 (×3): 2 [IU] via SUBCUTANEOUS

## 2016-05-17 MED ORDER — ROSUVASTATIN CALCIUM 10 MG PO TABS
10.0000 mg | ORAL_TABLET | ORAL | Status: DC
  Administered 2016-05-21 – 2016-05-23 (×2): 10 mg via ORAL
  Filled 2016-05-17 (×2): qty 1

## 2016-05-17 MED ORDER — FUROSEMIDE 10 MG/ML IJ SOLN
40.0000 mg | Freq: Once | INTRAMUSCULAR | Status: AC
  Administered 2016-05-17: 40 mg via INTRAVENOUS
  Filled 2016-05-17: qty 4

## 2016-05-17 MED ORDER — FUROSEMIDE 10 MG/ML IJ SOLN
20.0000 mg | Freq: Once | INTRAMUSCULAR | Status: AC
  Administered 2016-05-17: 20 mg via INTRAVENOUS
  Filled 2016-05-17: qty 2

## 2016-05-17 MED ORDER — SODIUM CHLORIDE 0.9 % IV BOLUS (SEPSIS)
1000.0000 mL | Freq: Once | INTRAVENOUS | Status: AC
  Administered 2016-05-17: 1000 mL via INTRAVENOUS

## 2016-05-17 MED ORDER — LORAZEPAM 2 MG/ML IJ SOLN: 1.0000 mg | Freq: Once | INTRAMUSCULAR | Status: DC | PRN

## 2016-05-17 MED ORDER — INSULIN ASPART 100 UNIT/ML ~~LOC~~ SOLN: 0.0000 [IU] | Freq: Every day | SUBCUTANEOUS | Status: DC

## 2016-05-17 NOTE — Progress Notes (Signed)
New Admission Note:   Arrival Method: Via stretcher from ED Mental Orientation:  A & O x 4 Telemetry: Placed on Tele Box [phone] Assessment: Completed Skin:  MSAD to sacrum IV: Pain: Denies Tubes:  None Safety Measures: Safety Fall Prevention Plan has been given, discussed and signed Admission: Completed [facility] Orientation: Patient has been orientated to the room, unit and staff.  Family:  From home with daughter and grandson  Cell phone at bedside.  She is aware of [facility]'s valuables policy.  Orders have been reviewed and implemented. Will continue to monitor the patient. Call light has been placed within reach and bed alarm has been activated.   Earleen Reaper RN- [place], [place] Phone number: [phone]

## 2016-05-17 NOTE — Progress Notes (Signed)
[facility] made aware of Lactic Acid of 3.2 and Calcium of 14.4.  No new orders received.  Will continue to monitor patient.  [employer] RN-BC, WTA.

## 2016-05-17 NOTE — Progress Notes (Signed)
IR request received for biopsy Imaging reviewed by Dr. Vernard Gambles Agree with MRI liver, but will likely favor CT guided biopsy of retroperitoneal adenopathy Plan for procedure Monday  Ascencion Dike PA-C Interventional Radiology 05/17/2016 11:22 AM

## 2016-05-17 NOTE — Progress Notes (Addendum)
PROGRESS NOTE                                                                                                                                                                                                             Patient Demographics:    Grace Patel, is a 75 y.o. female, DOB - 04-Oct-1940, [id number]  Admit date - 05/16/2016   Admitting Physician Reubin Milan, MD  Outpatient Primary MD for the patient is No primary care provider on file.  LOS - 1  Chief Complaint  Patient presents with  . Flank Pain       Brief Narrative     Grace Patel is a 75 y.o. female with medical history significant of type 2 diabetes, hyperlipidemia, hypertension who comes to the ED referred by her PCP due to several weeks of nausea/vomiting, flank pain and ct scan abdomen/pelvis showing left hydronephrosis, multiple adenopathies and liver lesion suspicious for malignancy.   Per patient, she has been having nausea, emesis, diarrhea fatigue and malaise in her.  She denies fever, chills, but complains of night sweats, the past few days. She denies abdominal pain, melena or hematochezia. She denies GU symptoms. She denies chest pain, palpitations, dyspnea, pitting edema of the lower extremities.   In the ER, the patient was initially hypoglycemic, which has resolved with dextrose IV. She was initially hypothermic, but her temperature is normal now. She is in no acute distress.   ED Course: The patient received IV dextrose, IV antibiotics and warming blanket reporting relief. Workup shows mildly abnormal urinalysis,  lactic acid of 2.51 mmol/L, hypercalcemia of 14.5 mg/dL, BUN 53 mg/dL, creatinine 1.56 mg/dL. Normal white blood cell count and hemoglobin.   Subjective:    Grace Patel today has, No headache, No chest pain, No abdominal pain - No Nausea, No new weakness tingling or numbness, No Cough - SOB.     Assessment  &  Plan :     1.Severe hypercalcemia induced nausea vomiting, dehydration, acute renal failure and weakness. Suspicious for underlying lymphoproliferative disorder based on CT scan findings, hypercalcemia treatment underway with IV fluids, Lasix, calcitonin and pamidronate which was given upon admission. We'll continue to hydrate and diurese. Monitor calcium levels which are improving. Continue supportive care.  2. Abdominal liver neuropathy, possible liver mass, elevated calcium. Suspicious for lymphoproliferative disorder. Ordered  LDH and CEA levels, peripheral smear, have requested oncology to evaluate, CT chest ordered, liver MRI to see if she has a mass which can be biopsied. We will order peripheral smear as well.  3. Left-sided hydronephrosis, likely due to abdominal adenopathy. Currently renal function is stable, urology has been consulted. Will monitor.  4. Hypertension hypertension. Continue home hospital blocker. ACE inhibitor and diuretic due to renal failure.  5. ARF due to combination of #1, #2 and #3. Hydrate and monitor, avoid nephrotoxins, renal function improving.   6. Dyslipidemia. Resume home dose statin.   7. DM type II. complicated by hypoglycemia due to patient being on Actos in the presence of renal failure. D5W and monitor, sliding scale as needed.  No results found for: HGBA1C CBG (last 3)   Recent Labs  05/16/16 1742 05/16/16 2122 05/17/16 0752  GLUCAP 149* 104* 139*      Family Communication  :  None  Code Status :  Full  Diet : Heart Healthy Low Carb  Disposition Plan  :  Stay inpt  Consults  :  Onco, IR, Urology  Procedures  :    CT scan abdomen and pelvis with abdominal lymphadenopathy, possible liver mass, left-sided obstructive uropathy, possible cirrhosis.  DVT Prophylaxis  :  Heparin    Lab Results  Component Value Date   PLT 231 05/17/2016    Inpatient Medications  Scheduled Meds: . aspirin EC  81 mg Oral Daily  . atenolol  100  mg Oral QPM  . cefTRIAXone (ROCEPHIN)  IV  1 g Intravenous Q24H  . feeding supplement (ENSURE ENLIVE)  237 mL Oral BID BM  . [START ON 05/18/2016] furosemide  40 mg Oral Daily  . heparin  5,000 Units Subcutaneous Q8H  . sodium chloride flush  3 mL Intravenous Q12H   Continuous Infusions: . dextrose 5 % and 0.9 % NaCl with KCl 20 mEq/L 125 mL/hr at 05/17/16 1037   PRN Meds:.ondansetron **OR** ondansetron (ZOFRAN) IV  Antibiotics  :    Anti-infectives    Start     Dose/Rate Route Frequency Ordered Stop   05/17/16 2000  cefTRIAXone (ROCEPHIN) 1 g in dextrose 5 % 50 mL IVPB     1 g 100 mL/hr over 30 Minutes Intravenous Every 24 hours 05/16/16 2158     05/16/16 1615  cefTRIAXone (ROCEPHIN) 1 g in dextrose 5 % 50 mL IVPB     1 g 100 mL/hr over 30 Minutes Intravenous  Once 05/16/16 1614 05/16/16 1950         Objective:   Vitals:   05/16/16 2015 05/16/16 2124 05/17/16 0340 05/17/16 0759  BP: (!) 123/53 (!) 126/57 (!) 133/55 (!) 118/40  Pulse: 75 79 75 73  Resp:  20 19 18   Temp:  97.5 F (36.4 C) 98.3 F (36.8 C) 98.2 F (36.8 C)  TempSrc:  Oral Oral Oral  SpO2: 98% 97% 94% 96%  Weight:  79.5 kg (175 lb 4.3 oz)    Height:  4' 11.5" (1.511 m)      Wt Readings from Last 3 Encounters:  05/16/16 79.5 kg (175 lb 4.3 oz)     Intake/Output Summary (Last 24 hours) at 05/17/16 1039 Last data filed at 05/17/16 1032  Gross per 24 hour  Intake          2654.58 ml  Output              750 ml  Net  1904.58 ml     Physical Exam  Awake Alert, Oriented X 3, No new F.N deficits, Normal affect [place].AT,PERRAL Supple Neck,No JVD, No cervical lymphadenopathy appriciated.  Symmetrical Chest wall movement, Good air movement bilaterally, CTAB RRR,No Gallops,Rubs or new Murmurs, No Parasternal Heave +ve B.Sounds, Abd Soft, No tenderness, No organomegaly appriciated, No rebound - guarding or rigidity. No Cyanosis, Clubbing or edema, No new Rash or bruise       Data Review:     CBC  Recent Labs Lab 05/16/16 1446 05/17/16 0707  WBC 10.1 5.6  HGB 12.6 10.4*  HCT 38.2 32.7*  PLT 365 231  MCV 84.0 84.5  MCH 27.7 26.9  MCHC 33.0 31.8  RDW 14.0 14.2  LYMPHSABS 1.5 0.6*  MONOABS 1.1* 0.8  EOSABS 0.1 0.1  BASOSABS 0.0 0.0    Chemistries   Recent Labs Lab 05/16/16 1446 05/16/16 2250 05/17/16 0707  NA 134* 134* 138  K 3.9 4.1 4.3  CL 100* 102 105  CO2 20* 24 25  GLUCOSE 34* 86 134*  BUN 53* 52* 42*  CREATININE 1.56* 1.71* 1.58*  CALCIUM 14.5* 14.1* 12.3*  AST 34  --  43*  ALT 15  --  13*  ALKPHOS 67  --  54  BILITOT 0.9  --  0.5   ------------------------------------------------------------------------------------------------------------------ No results for input(s): CHOL, HDL, LDLCALC, TRIG, CHOLHDL, LDLDIRECT in the last 72 hours.  No results found for: HGBA1C ------------------------------------------------------------------------------------------------------------------ No results for input(s): TSH, T4TOTAL, T3FREE, THYROIDAB in the last 72 hours.  Invalid input(s): FREET3 ------------------------------------------------------------------------------------------------------------------ No results for input(s): VITAMINB12, FOLATE, FERRITIN, TIBC, IRON, RETICCTPCT in the last 72 hours.  Coagulation profile No results for input(s): INR, PROTIME in the last 168 hours.  No results for input(s): DDIMER in the last 72 hours.  Cardiac Enzymes No results for input(s): CKMB, TROPONINI, MYOGLOBIN in the last 168 hours.  Invalid input(s): CK ------------------------------------------------------------------------------------------------------------------ No results found for: BNP  Micro Results No results found for this or any previous visit (from the past 240 hour(s)).  Radiology Reports Ct Abdomen W Contrast  Result Date: 05/16/2016 CLINICAL DATA:  Nausea and vomiting for several weeks. EXAM: CT ABDOMEN WITH CONTRAST TECHNIQUE:  Multidetector CT imaging of the abdomen was performed using the standard protocol following bolus administration of intravenous contrast. CONTRAST:  117mL ISOVUE-300 IOPAMIDOL (ISOVUE-300) INJECTION 61% COMPARISON:  None. FINDINGS: Lower chest: There is a small left pleural effusion identified. The lung bases appear clear. Hepatobiliary: The liver appears cirrhotic. The contour the liver is diffusely nodular and there is hypertrophy of the caudate lobe lateral segment of left lobe. Indeterminate structure in the right lobe of liver measures 3.5 x 1.6 cm and 46 HU. The gallbladder appears normal. No biliary dilatation. Pancreas: No inflammation or mass identified. Spleen: The spleen appears normal. The spleen measures 10 cm in length. Adrenals/Urinary Tract: Normal appearance of the adrenal glands. The kidneys half an irregular contour bilaterally suggestive of chronic scarring. There is left-sided hydronephrosis. Stomach/Bowel: The stomach appears normal. The small bowel loops have a normal course and caliber. No pathologic dilatation of the colon. Vascular/Lymphatic: Aortic atherosclerosis is noted. No aneurysm. Extensive retroperitoneal adenopathy is identified. Nodal mass within the retroperitoneum measures 9.3 x 5.0 cm, image 35 of series 3. There are numerous soft tissue nodules identified within the left-sided perinephric space. Other: Mild soft tissue nodularity within the perineum is identified along with ascites. In the setting of cirrhosis the ascites is nonspecific. Musculoskeletal: No aggressive lytic or sclerotic bone lesions. IMPRESSION:  1. Extensive adenopathy is identified within the upper abdomen which is worrisome for either a metastatic adenopathy versus lymphoproliferative disorder such as lymphoma. Further assessment with PET-CT and tissue sampling is recommended. 2. There is evidence of soft tissue nodularity within the peritoneum as well as within the left perinephric fat. Findings are  suspicious for peritoneal spread of tumor 3. Ascites 4. Left-sided obstructive uropathy. Favor obstruction of the left ureter secondary to retroperitoneal adenopathy. 5. Morphologic features of the liver compatible with cirrhosis. 6. Indeterminate intermediate attenuating structure within right lobe of liver. A more definitive assessment of this structure could be obtained with a contrast enhanced MR of the liver. 7. Aortic atherosclerosis.  No aneurysm. Electronically Signed   By: Kerby Moors M.D.   On: 05/16/2016 13:23    Time Spent in minutes  30   Kamisha Ell K M.D on 05/17/2016 at 10:39 AM  Between 7am to 7pm - Pager - [phone]  After 7pm go to [email or web address] - password [facility]  [facility] -  Office  [phone]

## 2016-05-17 NOTE — Consult Note (Signed)
12:40 PM   Grace Patel [birth year] [id number]  Referring provider: Dr. Ronnie Derby  Chief Complaint  Patient presents with  . Flank Pain    HPI: The patient is a 75 year old female who presented with nausea, diarrhea, malaise, and not feeling well. Workup revealed extensive adenopathy within her abdomen. She also had left hydronephrosis likely from this adenopathy noted on the scan. She is having no flank pain this time. She has no history of nephrolithiasis. She denies burning with urination. Her Cr is 1.58. She scheduled to undergo a biopsy in the near future this adenopathy. She has no other previous GU history.   PMH: Past Medical History:  Diagnosis Date  . Diabetes mellitus without complication ([place])   . Hypercholesteremia   . Hypertension     Surgical History: Past Surgical History:  Procedure Laterality Date  . ABDOMINAL HYSTERECTOMY        Allergies:  Allergies  Allergen Reactions  . Codeine     Makes me sick     Family History: Family History  Problem Relation Age of Onset  . Heart attack Father   . Heart disease Sister   . Lung disease Brother     Asbestosis  . Heart attack Brother     Social History:  reports that she has never smoked. She has never used smokeless tobacco. She reports that she does not drink alcohol or use drugs.  ROS: 12 point ROS negative except for above                                        Physical Exam: BP (!) 118/40 (BP Location: Left Arm)   Pulse 73   Temp 98.2 F (36.8 C) (Oral)   Resp 18   Ht 4' 11.5" (1.511 m)   Wt 175 lb 4.3 oz (79.5 kg)   SpO2 96%   BMI 34.81 kg/m   Constitutional:  Alert and oriented, No acute distress. HEENT:  AT, moist mucus membranes.  Trachea midline, no masses. Cardiovascular: No clubbing, cyanosis, or edema. Respiratory: Normal respiratory effort, no increased work of breathing. GI: Abdomen is soft, nontender, nondistended, no abdominal masses GU: No CVA  tenderness.  Skin: No rashes, bruises or suspicious lesions. Lymph: No cervical or inguinal adenopathy. Neurologic: Grossly intact, no focal deficits, moving all 4 extremities. Psychiatric: Normal mood and affect.  Laboratory Data: Lab Results  Component Value Date   WBC 5.6 05/17/2016   HGB 10.4 (L) 05/17/2016   HCT 32.7 (L) 05/17/2016   MCV 84.5 05/17/2016   PLT 231 05/17/2016    Lab Results  Component Value Date   CREATININE 1.58 (H) 05/17/2016    No results found for: PSA  No results found for: TESTOSTERONE  No results found for: HGBA1C  Urinalysis    Component Value Date/Time   COLORURINE YELLOW 05/16/2016 [place] 05/16/2016 1905   LABSPEC 1.046 (H) 05/16/2016 1905   PHURINE 5.0 05/16/2016 1905   GLUCOSEU NEGATIVE 05/16/2016 1905   HGBUR MODERATE (A) 05/16/2016 1905   BILIRUBINUR NEGATIVE 05/16/2016 1905   KETONESUR NEGATIVE 05/16/2016 1905   PROTEINUR NEGATIVE 05/16/2016 1905   NITRITE NEGATIVE 05/16/2016 1905   LEUKOCYTESUR SMALL (A) 05/16/2016 1905    Pertinent Imaging: CLINICAL DATA:  Nausea and vomiting for several weeks.  EXAM: CT ABDOMEN WITH CONTRAST  TECHNIQUE: Multidetector CT imaging of the abdomen was performed using the  standard protocol following bolus administration of intravenous contrast.  CONTRAST:  155mL ISOVUE-300 IOPAMIDOL (ISOVUE-300) INJECTION 61%  COMPARISON:  None.  FINDINGS: Lower chest: There is a small left pleural effusion identified. The lung bases appear clear.  Hepatobiliary: The liver appears cirrhotic. The contour the liver is diffusely nodular and there is hypertrophy of the caudate lobe lateral segment of left lobe. Indeterminate structure in the right lobe of liver measures 3.5 x 1.6 cm and 46 HU. The gallbladder appears normal. No biliary dilatation.  Pancreas: No inflammation or mass identified.  Spleen: The spleen appears normal. The spleen measures 10 cm  in length.  Adrenals/Urinary Tract: Normal appearance of the adrenal glands. The kidneys half an irregular contour bilaterally suggestive of chronic scarring. There is left-sided hydronephrosis.  Stomach/Bowel: The stomach appears normal. The small bowel loops have a normal course and caliber. No pathologic dilatation of the colon.  Vascular/Lymphatic: Aortic atherosclerosis is noted. No aneurysm. Extensive retroperitoneal adenopathy is identified. Nodal mass within the retroperitoneum measures 9.3 x 5.0 cm, image 35 of series 3. There are numerous soft tissue nodules identified within the left-sided perinephric space.  Other: Mild soft tissue nodularity within the perineum is identified along with ascites. In the setting of cirrhosis the ascites is nonspecific.  Musculoskeletal: No aggressive lytic or sclerotic bone lesions.  IMPRESSION: 1. Extensive adenopathy is identified within the upper abdomen which is worrisome for either a metastatic adenopathy versus lymphoproliferative disorder such as lymphoma. Further assessment with PET-CT and tissue sampling is recommended. 2. There is evidence of soft tissue nodularity within the peritoneum as well as within the left perinephric fat. 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 Ascites 4. Left-sided obstructive uropathy. Favor obstruction of the left ureter secondary to retroperitoneal adenopathy. 5. Morphologic features of the liver compatible with cirrhosis. 6. Indeterminate intermediate attenuating structure within right lobe of liver. A more definitive assessment of this structure could be obtained with a contrast enhanced MR of the liver. 7. Aortic atherosclerosis.  No aneurysm.  Assessment & Plan:    1. Left hydronephrosis due to extrinsic compression 2. 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 She may  benefit from left ureteral stent placement on an elective basis as an outpatient as this is a chronic slowly progressing problem and her renal function is relatively stable. Recommend outpatient follow-up to discuss possible stent placement in further detail.  Nickie Retort, MD

## 2016-05-17 NOTE — Consult Note (Signed)
Referral MD  Reason for Referral: Hypercalcemia and retroperitoneal adenopathy.   Chief Complaint  Patient presents with  . Flank Pain  : I came here because I had abdominal pain.  HPI: Grace Patel is a very charming 75 year old white female. She actually lives in [place]. She comes restart a her family doctor who is Dr. Donnie Coffin.  She does have history of diabetes.  She has a known liver lesion. This is noted, mildly back to 2002. Patient came in because of some abdominal pain. She is not feeling well. She cannot eat much. She is having some diarrhea.  She was found to be quite hypercalcemic. She was admitted yesterday. Her calcium was 14.1. Her albumin is 2.7 area and her creatinine is 1.71. Patient was given IV fluids. She's given pamidronate. Today, her calcium is 12.3. Her creatinine is 1.58.  She does have mild anemia. Hemoglobin is 10.4 with a hematocrit of 32.7. Her white cell count is 5.6.  Shows CT of the abdomen. This showed significant retroperitoneal adenopathy. The lymph node mass measured 9.3 x 5 7 m. She had some perineum nodularity. She had numerous soft tissue nodules in the left perinephric space.  Of interest is that he had that she has had a past history of kidney cancer. She is not sure which kidney was. She had a partial nephrectomy from what it sounds like. It sounds like this was on the left side.  She has some left-sided obstructive uropathy. I think she may schedule have a stent placed.  The CT scan showed a liver lesion. Again she is noted to have one since 2002. Her lesion measured 3.5 x 1.67cm.  She had an MRI back in January 2002. This showed a lobulated [place] lesion in the liver. The radiologist does not tell us what side it is in. It measures 5.5 x 4.5 cm. Also noted is a small hydrocele signal in the left hepatic lobe which is stable.  We are asked to see her to try to help out with management.  She never smoked. She's had no  bleeding or bruising. She has not had a mammogram for several years. She just does not want one. She's not noted any changes in her breasts. She's had no cough. She's had no chest wall pain. She's had no leg swelling.  She has one daughter lives in [place]. Another one lives in [place].   She does not think there is any history of cancer in the family.  She does not know when her last colonoscopy was done. Somehow, it would not surprise me she's never had one done.  She has some diarrhea.  Overall, her performance status is ECOG 1.    Past Medical History:  Diagnosis Date  . Diabetes mellitus without complication ([place])   . Hypercholesteremia   . Hypertension   :  Past Surgical History:  Procedure Laterality Date  . ABDOMINAL HYSTERECTOMY    :   Current Facility-Administered Medications:  .  aspirin EC tablet 81 mg, 81 mg, Oral, Daily, Reubin Milan, MD, 81 mg at 05/17/16 0919 .  atenolol (TENORMIN) tablet 100 mg, 100 mg, Oral, QPM, Reubin Milan, MD, 100 mg at 05/16/16 2311 .  cefTRIAXone (ROCEPHIN) 1 g in dextrose 5 % 50 mL IVPB, 1 g, Intravenous, Q24H, Reubin Milan, MD .  dextrose 5 % and 0.9 % NaCl with KCl 20 mEq/L infusion, , Intravenous, Continuous, Thurnell Lose, MD, Last Rate: 125 mL/hr at 05/17/16  1037 .  feeding supplement (ENSURE ENLIVE) (ENSURE ENLIVE) liquid 237 mL, 237 mL, Oral, BID BM, Reubin Milan, MD .  furosemide (LASIX) injection 20 mg, 20 mg, Intravenous, Once, Thurnell Lose, MD .  Derrill Memo ON 05/18/2016] furosemide (LASIX) tablet 40 mg, 40 mg, Oral, Daily, Thurnell Lose, MD .  heparin injection 5,000 Units, 5,000 Units, Subcutaneous, Q8H, Reubin Milan, MD, 5,000 Units at 05/16/16 2302 .  insulin aspart (novoLOG) injection 0-5 Units, 0-5 Units, Subcutaneous, QHS, Thurnell Lose, MD .  insulin aspart (novoLOG) injection 0-9 Units, 0-9 Units, Subcutaneous, TID WC, Thurnell Lose, MD .  LORazepam (ATIVAN) injection 1  mg, 1 mg, Intravenous, Once PRN, Thurnell Lose, MD .  ondansetron (ZOFRAN) tablet 4 mg, 4 mg, Oral, Q6H PRN **OR** ondansetron (ZOFRAN) injection 4 mg, 4 mg, Intravenous, Q6H PRN, Reubin Milan, MD, 4 mg at 05/16/16 2212 .  [START ON 05/19/2016] rosuvastatin (CRESTOR) tablet 10 mg, 10 mg, Oral, Once per day on Mon Wed Fri, Prashant K Singh, MD .  sodium chloride flush (NS) 0.9 % injection 3 mL, 3 mL, Intravenous, Q12H, Reubin Milan, MD, 3 mL at 05/17/16 0919:  . aspirin EC  81 mg Oral Daily  . atenolol  100 mg Oral QPM  . cefTRIAXone (ROCEPHIN)  IV  1 g Intravenous Q24H  . feeding supplement (ENSURE ENLIVE)  237 mL Oral BID BM  . furosemide  20 mg Intravenous Once  . [START ON 05/18/2016] furosemide  40 mg Oral Daily  . heparin  5,000 Units Subcutaneous Q8H  . insulin aspart  0-5 Units Subcutaneous QHS  . insulin aspart  0-9 Units Subcutaneous TID WC  . [START ON 05/19/2016] rosuvastatin  10 mg Oral Once per day on Mon Wed Fri  . sodium chloride flush  3 mL Intravenous Q12H  :  Allergies  Allergen Reactions  . Codeine     Makes me sick   :  Family History  Problem Relation Age of Onset  . Heart attack Father   . Heart disease Sister   . Lung disease Brother     Asbestosis  . Heart attack Brother   :  Social History   Social History  . Marital status: Widowed    Spouse name: N/A  . Number of children: N/A  . Years of education: N/A   Occupational History  . Not on file.   Social History Main Topics  . Smoking status: Never Smoker  . Smokeless tobacco: Never Used  . Alcohol use No  . Drug use: No  . Sexual activity: No   Other Topics Concern  . Not on file   Social History Narrative  . No narrative on file  :  Pertinent items are noted in HPI.  Exam: Patient Vitals for the past 24 hrs:  BP Temp Temp src Pulse Resp SpO2 Height Weight  05/17/16 0759 (!) 118/40 98.2 F (36.8 C) Oral 73 18 96 % - -  05/17/16 0340 (!) 133/55 98.3 F (36.8 C) Oral  75 19 94 % - -  05/16/16 2124 (!) 126/57 97.5 F (36.4 C) Oral 79 20 97 % 4' 11.5" (1.511 m) 175 lb 4.3 oz (79.5 kg)  05/16/16 2015 (!) 123/53 - - 75 - 98 % - -  05/16/16 1945 (!) 106/54 - - 72 22 98 % - -  05/16/16 1935 - 98.3 F (36.8 C) Rectal - - - - -  05/16/16 1930 (!) 106/47 - -  72 22 97 % - -  05/16/16 1915 121/55 - - 72 24 99 % - -  05/16/16 1900 125/90 - - 74 21 98 % - -  05/16/16 1845 (!) 118/38 - - 73 25 97 % - -  05/16/16 1830 101/56 - - 74 24 98 % - -  05/16/16 1815 (!) 120/51 - - 73 22 98 % - -  05/16/16 1730 119/87 - - 69 23 100 % - -  05/16/16 1700 (!) 134/53 - - 67 24 100 % - -  05/16/16 1645 117/56 - - 69 17 100 % - -  05/16/16 1632 122/74 - - 65 23 100 % - -  05/16/16 1615 (!) 147/52 - - 73 23 100 % - -  05/16/16 1553 (!) 120/53 - - 66 12 99 % - -  05/16/16 1532 - (!) 95.1 F (35.1 C) Rectal - - - - -  05/16/16 1442 (!) 136/54 - - 72 14 96 % - -    As above    Recent Labs  05/16/16 1446 05/17/16 0707  WBC 10.1 5.6  HGB 12.6 10.4*  HCT 38.2 32.7*  PLT 365 231    Recent Labs  05/16/16 2250 05/17/16 0707  NA 134* 138  K 4.1 4.3  CL 102 105  CO2 24 25  GLUCOSE 86 134*  BUN 52* 42*  CREATININE 1.71* 1.58*  CALCIUM 14.1* 12.3*    Blood smear review:  None  Pathology: None     Assessment and Plan:  Mrs. Eighmy is a 75 year old white female. She has retroperitoneal adenopathy and peritoneal nodules.  She has this liver lesion which appears to be more chronic than anything else.  As always, the recommendations will hinge on the biopsy. I'm sure that radiology will get several core biopsies for [place].  I cannot help but remind myself that she has had renal cell carcinoma. I have to believe that this is renal cell carcinoma. I does wonder if this is not recurrent disease. It has been probably 7 years or so. She really is vague as to the details of this kidney cancer.  A biopsy should easily tell whether or not she has renal cell carcinoma.  I  probably would get a CT of the chest. I did this would be helpful. I'll get it without contrast because of her renal function.  I would get a LDH on her period. The hypercalcemia can certainly go along with renal cell carcinoma.  For now, there is no much that we did do outside await the biopsy. Again, some additional studies might help [place] out. I really don't think we need any tumor markers right now.  She is very nice. If we documented malignancy, I think she probably will be treated down in [place] where one of her daughters lives. That would be fine from my point of view.  I spent about 45 minutes with her this morning. She is quite nice. She has a strong faith.  Grace Haw, MD  Jeneen Rinks 1:5-7

## 2016-05-18 ENCOUNTER — Inpatient Hospital Stay (HOSPITAL_COMMUNITY): Payer: Medicare Other

## 2016-05-18 LAB — GLUCOSE, CAPILLARY
GLUCOSE-CAPILLARY: 134 mg/dL — AB (ref 65–99)
GLUCOSE-CAPILLARY: 216 mg/dL — AB (ref 65–99)
GLUCOSE-CAPILLARY: 231 mg/dL — AB (ref 65–99)
Glucose-Capillary: 319 mg/dL — ABNORMAL HIGH (ref 65–99)

## 2016-05-18 LAB — COMPREHENSIVE METABOLIC PANEL
ALBUMIN: 3 g/dL — AB (ref 3.5–5.0)
ALK PHOS: 65 U/L (ref 38–126)
ALT: 15 U/L (ref 14–54)
ANION GAP: 7 (ref 5–15)
AST: 30 U/L (ref 15–41)
BUN: 29 mg/dL — ABNORMAL HIGH (ref 6–20)
CO2: 23 mmol/L (ref 22–32)
Calcium: 11.3 mg/dL — ABNORMAL HIGH (ref 8.9–10.3)
Chloride: 109 mmol/L (ref 101–111)
Creatinine, Ser: 1.23 mg/dL — ABNORMAL HIGH (ref 0.44–1.00)
GFR calc Af Amer: 48 mL/min — ABNORMAL LOW (ref >60)
GFR calc non Af Amer: 42 mL/min — ABNORMAL LOW (ref >60)
GLUCOSE: 144 mg/dL — AB (ref 65–99)
POTASSIUM: 3.9 mmol/L (ref 3.5–5.1)
SODIUM: 139 mmol/L (ref 135–145)
Total Bilirubin: 0.6 mg/dL (ref 0.3–1.2)
Total Protein: 6.3 g/dL — ABNORMAL LOW (ref 6.5–8.1)

## 2016-05-18 LAB — CBC WITH DIFFERENTIAL/PLATELET
BASOS ABS: 0 10*3/uL (ref 0.0–0.1)
BASOS PCT: 0 %
Eosinophils Absolute: 0.2 10*3/uL (ref 0.0–0.7)
Eosinophils Relative: 4 %
HCT: 33.2 % — ABNORMAL LOW (ref 36.0–46.0)
HEMOGLOBIN: 10.7 g/dL — AB (ref 12.0–15.0)
Lymphocytes Relative: 11 %
Lymphs Abs: 0.6 10*3/uL — ABNORMAL LOW (ref 0.7–4.0)
MCH: 27.5 pg (ref 26.0–34.0)
MCHC: 32.2 g/dL (ref 30.0–36.0)
MCV: 85.3 fL (ref 78.0–100.0)
MONOS PCT: 10 %
Monocytes Absolute: 0.6 10*3/uL (ref 0.1–1.0)
NEUTROS ABS: 4 10*3/uL (ref 1.7–7.7)
NEUTROS PCT: 75 %
Platelets: 218 10*3/uL (ref 150–400)
RBC: 3.89 MIL/uL (ref 3.87–5.11)
RDW: 14.3 % (ref 11.5–15.5)
WBC: 5.4 10*3/uL (ref 4.0–10.5)

## 2016-05-18 LAB — URINE CULTURE: CULTURE: NO GROWTH

## 2016-05-18 LAB — HEMOGLOBIN A1C
Hgb A1c MFr Bld: 6.4 % — ABNORMAL HIGH (ref 4.8–5.6)
Mean Plasma Glucose: 137 mg/dL

## 2016-05-18 LAB — LACTATE DEHYDROGENASE: LDH: 145 U/L (ref 98–192)

## 2016-05-18 LAB — TSH: TSH: 0.884 u[IU]/mL (ref 0.350–4.500)

## 2016-05-18 MED ORDER — GADOBENATE DIMEGLUMINE 529 MG/ML IV SOLN
10.0000 mL | Freq: Once | INTRAVENOUS | Status: AC | PRN
  Administered 2016-05-18: 8 mL via INTRAVENOUS

## 2016-05-18 MED ORDER — ALBUTEROL SULFATE (2.5 MG/3ML) 0.083% IN NEBU
5.0000 mg | INHALATION_SOLUTION | Freq: Once | RESPIRATORY_TRACT | Status: AC
  Administered 2016-05-18: 5 mg via RESPIRATORY_TRACT
  Filled 2016-05-18: qty 6

## 2016-05-18 MED ORDER — FUROSEMIDE 40 MG PO TABS
40.0000 mg | ORAL_TABLET | Freq: Every day | ORAL | Status: AC
Start: 2016-05-18 — End: 2016-05-18
  Administered 2016-05-18: 40 mg via ORAL

## 2016-05-18 MED ORDER — HYDROCOD POLST-CPM POLST ER 10-8 MG/5ML PO SUER
5.0000 mL | Freq: Once | ORAL | Status: AC
Start: 2016-05-18 — End: 2016-05-18
  Administered 2016-05-18: 5 mL via ORAL
  Filled 2016-05-18: qty 5

## 2016-05-18 NOTE — Progress Notes (Signed)
Orders received for breathing treatment and cough medication given. Respiratory in to assess. Denies pain or discomfort. " I am sleepy."

## 2016-05-18 NOTE — Progress Notes (Signed)
PROGRESS NOTE                                                                                                                                                                                                             Patient Demographics:    Grace Patel, is a 75 y.o. female, DOB - Dec 16, 1940, [id number]  Admit date - 05/16/2016   Admitting Physician Reubin Milan, MD  Outpatient Primary MD for the patient is No primary care provider on file.  LOS - 2  Chief Complaint  Patient presents with  . Flank Pain       Brief Narrative     Grace Patel is a 75 y.o. female with medical history significant of type 2 diabetes, hyperlipidemia, hypertension who comes to the ED referred by her PCP due to several weeks of nausea/vomiting, flank pain and ct scan abdomen/pelvis showing left hydronephrosis, multiple adenopathies and liver lesion suspicious for malignancy.   Per patient, she has been having nausea, emesis, diarrhea fatigue and malaise in her.  She denies fever, chills, but complains of night sweats, the past few days. She denies abdominal pain, melena or hematochezia. She denies GU symptoms. She denies chest pain, palpitations, dyspnea, pitting edema of the lower extremities.   In the ER, the patient was initially hypoglycemic, which has resolved with dextrose IV. She was initially hypothermic, but her temperature is normal now. She is in no acute distress.   ED Course: The patient received IV dextrose, IV antibiotics and warming blanket reporting relief. Workup shows mildly abnormal urinalysis,  lactic acid of 2.51 mmol/L, hypercalcemia of 14.5 mg/dL, BUN 53 mg/dL, creatinine 1.56 mg/dL. Normal white blood cell count and hemoglobin.   Subjective:    [name] today has, No headache, No chest pain, No abdominal pain - No Nausea, No new weakness tingling or numbness, No Cough - SOB.     Assessment  &  Plan :     1.Severe hypercalcemia induced nausea vomiting, dehydration, acute renal failure and weakness. Suspicious for underlying lymphoproliferative disorder based on CT scan findings, hypercalcemia treatment underway with IV fluids, Lasix, calcitonin and pamidronate which was given upon admission. We'll continue to hydrate and diurese. Monitor calcium levels which are improving. Continue supportive care. Pending PTH, PTH RP, 1, 25 and 25 hydroxy vitamin D levels.  2. Abdominal  liver neuropathy, possible liver mass, elevated calcium. Suspicious for lymphoproliferative disorder. Stable LDH, pending peripheral smear and CEA levels, CT chest unremarkable liver MRI showing infiltration of sauce muscle suspicious for sarcoma versus lymphoma along with diffuse lymphadenopathy in the abdomen. Oncology is following, IR on board for possible biopsy of either the lymph node or any other adjacent easily accessible abdominal structure for tissue pathology.  3. Left-sided hydronephrosis, likely due to abdominal adenopathy. Currently renal function is stable, urology following, likely outpatient stent placement.  4. Hypertension. Continue home hospital blocker. ACE inhibitor and diuretic due to renal failure.  5. ARF due to combination of #1, #2 and #3. Hydrate and monitor, avoid nephrotoxins, renal function improving.   6. Dyslipidemia. Resume home dose statin.   7. DM type II. complicated by hypoglycemia due to patient being on Actos in the presence of renal failure. D5W and monitor, sliding scale as needed.  No results found for: HGBA1C CBG (last 3)   Recent Labs  05/17/16 1731 05/17/16 2236 05/18/16 0738  GLUCAP 125* 184* 134*      Family Communication  :  None  Code Status :  Full  Diet : Heart Healthy Low Carb  Disposition Plan  :  Stay inpt  Consults  :  Onco, IR, Urology  Procedures  :    CT scan abdomen and pelvis with abdominal lymphadenopathy, possible liver mass, left-sided  obstructive uropathy, possible cirrhosis.  MRI of the liver showing malignant alteration of the psoas muscle suspicious for sarcoma versus lymphoma along with diffuse liver neuropathy and left-sided obstructive uropathy.   DVT Prophylaxis  :  Heparin    Lab Results  Component Value Date   PLT 218 05/18/2016    Inpatient Medications  Scheduled Meds: . aspirin EC  81 mg Oral Daily  . atenolol  100 mg Oral QPM  . cefTRIAXone (ROCEPHIN)  IV  1 g Intravenous Q24H  . feeding supplement (ENSURE ENLIVE)  237 mL Oral BID BM  . furosemide  40 mg Oral Daily  . heparin  5,000 Units Subcutaneous Q8H  . insulin aspart  0-5 Units Subcutaneous QHS  . insulin aspart  0-9 Units Subcutaneous TID WC  . [START ON 05/19/2016] rosuvastatin  10 mg Oral Once per day on Mon Wed Fri  . sodium chloride flush  3 mL Intravenous Q12H   Continuous Infusions: . dextrose 5 % and 0.9 % NaCl with KCl 20 mEq/L 75 mL/hr at 05/17/16 2232   PRN Meds:.LORazepam, ondansetron **OR** ondansetron (ZOFRAN) IV  Antibiotics  :    Anti-infectives    Start     Dose/Rate Route Frequency Ordered Stop   05/17/16 2000  cefTRIAXone (ROCEPHIN) 1 g in dextrose 5 % 50 mL IVPB     1 g 100 mL/hr over 30 Minutes Intravenous Every 24 hours 05/16/16 2158     05/16/16 1615  cefTRIAXone (ROCEPHIN) 1 g in dextrose 5 % 50 mL IVPB     1 g 100 mL/hr over 30 Minutes Intravenous  Once 05/16/16 1614 05/16/16 1950         Objective:   Vitals:   05/17/16 1724 05/17/16 2033 05/18/16 0543 05/18/16 0900  BP: 118/80 122/84 132/67 120/74  Pulse: 81 74 80 80  Resp: 18 18 16 16   Temp: 98.5 F (36.9 C) 98.7 F (37.1 C) 98 F (36.7 C) 97.8 F (36.6 C)  TempSrc: Oral Oral Oral Oral  SpO2: 97% 97% 97% 98%  Weight:  79.5 kg (175 lb 4.3  oz)    Height:        Wt Readings from Last 3 Encounters:  05/17/16 79.5 kg (175 lb 4.3 oz)     Intake/Output Summary (Last 24 hours) at 05/18/16 0923 Last data filed at 05/18/16 0900  Gross per 24  hour  Intake          2606.25 ml  Output              500 ml  Net          2106.25 ml     Physical Exam  Awake Alert, Oriented X 3, No new F.N deficits, Normal affect [place].AT,PERRAL Supple Neck,No JVD, No cervical lymphadenopathy appriciated.  Symmetrical Chest wall movement, Good air movement bilaterally, CTAB RRR,No Gallops,Rubs or new Murmurs, No Parasternal Heave +ve B.Sounds, Abd Soft, No tenderness, No organomegaly appriciated, No rebound - guarding or rigidity. No Cyanosis, Clubbing or edema, No new Rash or bruise       Data Review:    CBC  Recent Labs Lab 05/16/16 1446 05/17/16 0707 05/18/16 0520  WBC 10.1 5.6 5.4  HGB 12.6 10.4* 10.7*  HCT 38.2 32.7* 33.2*  PLT 365 231 218  MCV 84.0 84.5 85.3  MCH 27.7 26.9 27.5  MCHC 33.0 31.8 32.2  RDW 14.0 14.2 14.3  LYMPHSABS 1.5 0.6* 0.6*  MONOABS 1.1* 0.8 0.6  EOSABS 0.1 0.1 0.2  BASOSABS 0.0 0.0 0.0    Chemistries   Recent Labs Lab 05/16/16 1446 05/16/16 2250 05/17/16 0707 05/18/16 0520  NA 134* 134* 138 139  K 3.9 4.1 4.3 3.9  CL 100* 102 105 109  CO2 20* 24 25 23   GLUCOSE 34* 86 134* 144*  BUN 53* 52* 42* 29*  CREATININE 1.56* 1.71* 1.58* 1.23*  CALCIUM 14.5* 14.1* 12.3* 11.3*  AST 34  --  43* 30  ALT 15  --  13* 15  ALKPHOS 67  --  54 65  BILITOT 0.9  --  0.5 0.6   ------------------------------------------------------------------------------------------------------------------ No results for input(s): CHOL, HDL, LDLCALC, TRIG, CHOLHDL, LDLDIRECT in the last 72 hours.  No results found for: HGBA1C ------------------------------------------------------------------------------------------------------------------ No results for input(s): TSH, T4TOTAL, T3FREE, THYROIDAB in the last 72 hours.  Invalid input(s): FREET3 ------------------------------------------------------------------------------------------------------------------ No results for input(s): VITAMINB12, FOLATE, FERRITIN, TIBC, IRON,  RETICCTPCT in the last 72 hours.  Coagulation profile  Recent Labs Lab 05/17/16 1352  INR 1.30    No results for input(s): DDIMER in the last 72 hours.  Cardiac Enzymes No results for input(s): CKMB, TROPONINI, MYOGLOBIN in the last 168 hours.  Invalid input(s): CK ------------------------------------------------------------------------------------------------------------------ No results found for: BNP  Micro Results Recent Results (from the past 240 hour(s))  Blood culture (routine x 2)     Status: None (Preliminary result)   Collection Time: 05/16/16  5:20 PM  Result Value Ref Range Status   Specimen Description BLOOD RIGHT HAND  Final   Special Requests BOTTLES DRAWN AEROBIC AND ANAEROBIC 5CC EA  Final   Culture NO GROWTH < 24 HOURS  Final   Report Status PENDING  Incomplete  Blood culture (routine x 2)     Status: None (Preliminary result)   Collection Time: 05/16/16  5:40 PM  Result Value Ref Range Status   Specimen Description BLOOD LEFT HAND  Final   Special Requests IN PEDIATRIC BOTTLE 2CC  Final   Culture NO GROWTH < 24 HOURS  Final   Report Status PENDING  Incomplete    Radiology Reports Ct Chest Wo Contrast  Result Date: 05/17/2016 CLINICAL DATA:  Evaluate adenopathy.  Chronic fatigue. EXAM: CT CHEST WITHOUT CONTRAST TECHNIQUE: Multidetector CT imaging of the chest was performed following the standard protocol without IV contrast. COMPARISON:  None. FINDINGS: Cardiovascular: Normal heart size. No pericardial effusion. Aortic atherosclerosis noted. Calcification within the RCA, LAD and left circumflex coronary artery noted. Mediastinum/Nodes: The trachea appears patent and is midline. Normal appearance of the esophagus. No mediastinal or hilar adenopathy. There is no axillary or supraclavicular adenopathy. Lungs/Pleura: Small left pleural effusion is identified. There is no airspace consolidation or atelectasis. No suspicious pulmonary nodule or mass identified.  Upper Abdomen: Morphologic features a liver compatible cirrhosis identified. Again noted is a focal area of low attenuation within the right lobe of liver measuring 2.9 cm, image 123 of series 2. Upper abdominal adenopathy is again noted as described on CT from 05/16/2016. See report from that date for more detailed description of changes in the upper abdomen. Musculoskeletal: No aggressive lytic or sclerotic bone lesions identified within the upper abdomen. IMPRESSION: 1. No evidence for thoracic adenopathy. 2. Aortic atherosclerosis and multi vessel coronary artery calcification. 3. Small left pleural effusion. 4. See report from CT abdomen pelvis dated 05/16/2016 regarding details of the upper abdomen. Electronically Signed   By: Kerby Moors M.D.   On: 05/17/2016 18:10   Ct Abdomen W Contrast  Result Date: 05/16/2016 CLINICAL DATA:  Nausea and vomiting for several weeks. EXAM: CT ABDOMEN WITH CONTRAST TECHNIQUE: Multidetector CT imaging of the abdomen was performed using the standard protocol following bolus administration of intravenous contrast. CONTRAST:  138mL ISOVUE-300 IOPAMIDOL (ISOVUE-300) INJECTION 61% COMPARISON:  None. FINDINGS: Lower chest: There is a small left pleural effusion identified. The lung bases appear clear. Hepatobiliary: The liver appears cirrhotic. The contour the liver is diffusely nodular and there is hypertrophy of the caudate lobe lateral segment of left lobe. Indeterminate structure in the right lobe of liver measures 3.5 x 1.6 cm and 46 HU. The gallbladder appears normal. No biliary dilatation. Pancreas: No inflammation or mass identified. Spleen: The spleen appears normal. The spleen measures 10 cm in length. Adrenals/Urinary Tract: Normal appearance of the adrenal glands. The kidneys half an irregular contour bilaterally suggestive of chronic scarring. There is left-sided hydronephrosis. Stomach/Bowel: The stomach appears normal. The small bowel loops have a normal course  and caliber. No pathologic dilatation of the colon. Vascular/Lymphatic: Aortic atherosclerosis is noted. No aneurysm. Extensive retroperitoneal adenopathy is identified. Nodal mass within the retroperitoneum measures 9.3 x 5.0 cm, image 35 of series 3. There are numerous soft tissue nodules identified within the left-sided perinephric space. Other: Mild soft tissue nodularity within the perineum is identified along with ascites. In the setting of cirrhosis the ascites is nonspecific. Musculoskeletal: No aggressive lytic or sclerotic bone lesions. IMPRESSION: 1. Extensive adenopathy is identified within the upper abdomen which is worrisome for either a metastatic adenopathy versus lymphoproliferative disorder such as lymphoma. Further assessment with PET-CT and tissue sampling is recommended. 2. There is evidence of soft tissue nodularity within the peritoneum as well as within the left perinephric fat. 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 Ascites 4. Left-sided obstructive uropathy. Favor obstruction of the left ureter secondary to retroperitoneal adenopathy. 5. Morphologic features of the liver compatible with cirrhosis. 6. Indeterminate intermediate attenuating structure within right lobe of liver. A more definitive assessment of this structure could be obtained with a contrast enhanced MR of the liver. 7. Aortic atherosclerosis.  No aneurysm. Electronically Signed   By:  Kerby Moors M.D.   On: 05/16/2016 13:23   Mr Liver W Wo Contrast  Result Date: 05/18/2016 CLINICAL DATA:  75 year old female with nausea, diarrhea more days. Abdominal adenopathy. Indeterminate renal lesion EXAM: MRI ABDOMEN WITHOUT AND WITH CONTRAST TECHNIQUE: Multiplanar multisequence MR imaging of the abdomen was performed both before and after the administration of intravenous contrast. CONTRAST:  4mL MULTIHANCE GADOBENATE DIMEGLUMINE 529 MG/ML IV SOLN COMPARISON:  CT abdomen 05/16/2016 FINDINGS: Lower chest:  Small  LEFT Hepatobiliary: The liver has a nodular contour with cirrhosis. There is enlargement of the caudate lobe and some contraction in the RIGHT hepatic lobe. Geographic lesion central RIGHT hepatic lobe measures 2.59 x 2.2 cm has high signal intensity T2 weighted imaging. This lesion has no post-contrast enhancement is felt to represent benign focus of scar inflammation. Pancreas: Normal pancreatic parenchymal intensity. No ductal dilatation or inflammation. Spleen: Normal spleen. Adrenals/urinary tract: Nodularity the perinephric space. Hydronephrosis on the LEFT secondary to entrapment of the LEFT ureter. Potential stone within the ureter on comparison CT Stomach/Bowel: Stomach and limited of the small bowel is unremarkable Vascular/Lymphatic: Abdominal aorta is normal caliber. There is extensive lymphadenopathy surrounding the aorta from the level of the adrenal glands to the bifurcation. This masslike adenopathy measures 6.35 by 0.4 cm axial dimension on 75, series 1201. There is adjacent involvement of the LEFT psoas muscle which is expanded and enhancing measuring 5.8 x 5.4 cm (image 79, series 120). This compares to the small 2.1 x 4.1 cm normal RIGHT psoas muscle There is enhancing nodularity the LEFT perinephric space inferior to the LEFT kidney. Musculoskeletal: Malignant lesion of the LEFT psoas muscle as described above. No aggressive osseous lesion identified. 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 2. Adjacent bulky periaortic adenopathy near completely surrounding the aorta. Findings are more suggestive of metastatic adenopathy rather than retroperitoneal fibrosis. 3. 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 4. Cirrhotic liver with benign-appearing lesion in RIGHT hepatic. 5. Hydronephrosis on the LEFT secondary to entrapment of the LEFT ureter by the the retroperitoneal process.  Potential stone within the ureter additionally. Electronically Signed   By: Suzy Bouchard M.D.   On: 05/18/2016 08:40   Dg Chest Port 1 View  Result Date: 05/17/2016 CLINICAL DATA:  Shortness of breath EXAM: PORTABLE CHEST 1 VIEW COMPARISON:  11/22/2009 FINDINGS: The heart size appears normal. There is aortic atherosclerosis noted. Chronic asymmetric elevation of right hemidiaphragm noted. No pleural effusion or edema. IMPRESSION: 1. No acute cardiopulmonary abnormalities. 2. Aortic atherosclerosis. Electronically Signed   By: Kerby Moors M.D.   On: 05/17/2016 12:11    Time Spent in minutes  30   Zellie Jenning K M.D on 05/18/2016 at 9:23 AM  Between 7am to 7pm - Pager - [phone]  After 7pm go to [email or web address] - password [facility]  [facility] -  Office  [phone]

## 2016-05-18 NOTE — Progress Notes (Signed)
Patient complaining of SOB. Breath sounds diminished bilaterally. NPC noted. Oxygen continues at 2 LPM via [place]. Sats at 99%. NP paged awaiting return call.

## 2016-05-19 ENCOUNTER — Inpatient Hospital Stay (HOSPITAL_COMMUNITY): Payer: Medicare Other

## 2016-05-19 ENCOUNTER — Encounter (HOSPITAL_COMMUNITY): Payer: Self-pay | Admitting: Radiology

## 2016-05-19 LAB — COMPREHENSIVE METABOLIC PANEL
ALK PHOS: 63 U/L (ref 38–126)
ALT: 15 U/L (ref 14–54)
AST: 29 U/L (ref 15–41)
Albumin: 2.9 g/dL — ABNORMAL LOW (ref 3.5–5.0)
Anion gap: 9 (ref 5–15)
BUN: 19 mg/dL (ref 6–20)
CALCIUM: 10.1 mg/dL (ref 8.9–10.3)
CHLORIDE: 106 mmol/L (ref 101–111)
CO2: 24 mmol/L (ref 22–32)
CREATININE: 1.25 mg/dL — AB (ref 0.44–1.00)
GFR calc Af Amer: 48 mL/min — ABNORMAL LOW (ref >60)
GFR calc non Af Amer: 41 mL/min — ABNORMAL LOW (ref >60)
Glucose, Bld: 127 mg/dL — ABNORMAL HIGH (ref 65–99)
Potassium: 4 mmol/L (ref 3.5–5.1)
SODIUM: 139 mmol/L (ref 135–145)
Total Bilirubin: 0.4 mg/dL (ref 0.3–1.2)
Total Protein: 6 g/dL — ABNORMAL LOW (ref 6.5–8.1)

## 2016-05-19 LAB — CBC WITH DIFFERENTIAL/PLATELET
BASOS ABS: 0 10*3/uL (ref 0.0–0.1)
Basophils Relative: 0 %
EOS PCT: 4 %
Eosinophils Absolute: 0.2 10*3/uL (ref 0.0–0.7)
HCT: 33.9 % — ABNORMAL LOW (ref 36.0–46.0)
HEMOGLOBIN: 10.7 g/dL — AB (ref 12.0–15.0)
LYMPHS PCT: 12 %
Lymphs Abs: 0.6 10*3/uL — ABNORMAL LOW (ref 0.7–4.0)
MCH: 27 pg (ref 26.0–34.0)
MCHC: 31.6 g/dL (ref 30.0–36.0)
MCV: 85.6 fL (ref 78.0–100.0)
Monocytes Absolute: 0.6 10*3/uL (ref 0.1–1.0)
Monocytes Relative: 11 %
NEUTROS PCT: 73 %
Neutro Abs: 3.6 10*3/uL (ref 1.7–7.7)
PLATELETS: 201 10*3/uL (ref 150–400)
RBC: 3.96 MIL/uL (ref 3.87–5.11)
RDW: 14.4 % (ref 11.5–15.5)
WBC: 5 10*3/uL (ref 4.0–10.5)

## 2016-05-19 LAB — GLUCOSE, CAPILLARY
GLUCOSE-CAPILLARY: 146 mg/dL — AB (ref 65–99)
GLUCOSE-CAPILLARY: 159 mg/dL — AB (ref 65–99)
Glucose-Capillary: 91 mg/dL (ref 65–99)
Glucose-Capillary: 167 mg/dL — ABNORMAL HIGH (ref 65–99)
Glucose-Capillary: 198 mg/dL — ABNORMAL HIGH (ref 65–99)

## 2016-05-19 LAB — LACTATE DEHYDROGENASE: LDH: 164 U/L (ref 98–192)

## 2016-05-19 MED ORDER — FUROSEMIDE 10 MG/ML IJ SOLN
40.0000 mg | Freq: Once | INTRAMUSCULAR | Status: AC
  Administered 2016-05-19: 40 mg via INTRAVENOUS
  Filled 2016-05-19: qty 4

## 2016-05-19 MED ORDER — MIDAZOLAM HCL 2 MG/2ML IJ SOLN
INTRAMUSCULAR | Status: AC | PRN
  Administered 2016-05-19: 1 mg via INTRAVENOUS

## 2016-05-19 MED ORDER — LIDOCAINE HCL 1 % IJ SOLN
INTRAMUSCULAR | Status: AC
  Filled 2016-05-19: qty 20

## 2016-05-19 MED ORDER — FENTANYL CITRATE (PF) 100 MCG/2ML IJ SOLN
INTRAMUSCULAR | Status: AC | PRN
  Administered 2016-05-19: 25 ug via INTRAVENOUS

## 2016-05-19 MED ORDER — HEPARIN SODIUM (PORCINE) 5000 UNIT/ML IJ SOLN
5000.0000 [IU] | Freq: Three times a day (TID) | INTRAMUSCULAR | Status: DC
  Administered 2016-05-20 – 2016-05-22 (×7): 5000 [IU] via SUBCUTANEOUS
  Filled 2016-05-19 (×5): qty 1

## 2016-05-19 MED ORDER — FENTANYL CITRATE (PF) 100 MCG/2ML IJ SOLN
INTRAMUSCULAR | Status: AC
  Filled 2016-05-19: qty 2

## 2016-05-19 MED ORDER — MIDAZOLAM HCL 2 MG/2ML IJ SOLN
INTRAMUSCULAR | Status: AC
  Filled 2016-05-19: qty 2

## 2016-05-19 NOTE — Care Management Important Message (Signed)
Important Message  Patient Details  Name: Grace Patel MRN: [id number] Date of Birth: [birth year]   Medicare Important Message Given:  Yes    Raegan Winders 05/19/2016, 12:20 PM

## 2016-05-19 NOTE — Sedation Documentation (Signed)
Patient is resting comfortably. No complaints from pt at this time.  

## 2016-05-19 NOTE — Progress Notes (Signed)
Inpatient Diabetes Program Recommendations  AACE/ADA: New Consensus Statement on Inpatient Glycemic Control (2015)  Target Ranges:  Prepandial:   less than 140 mg/dL      Peak postprandial:   less than 180 mg/dL (1-2 hours)      Critically ill patients:  140 - 180 mg/dL   Results for SRIHITA, PROTHEROE (MRN [id number]) as of 05/19/2016 09:16  Ref. Range 05/18/2016 07:38 05/18/2016 11:28 05/18/2016 16:15 05/18/2016 20:35 05/19/2016 07:38  Glucose-Capillary Latest Ref Range: 65 - 99 mg/dL 134 (H) 319 (H) 216 (H) 231 (H) 146 (H)   Review of Glycemic Control  Diabetes history: DM2 Outpatient Diabetes medications: Amaryl 4 mg QAM, Metformin 1000 mg BID, Actos 30 mg daily Current orders for Inpatient glycemic control: Novolog 0-9 units TID with meals, Novolog 0-5 units QHS  Inpatient Diabetes Program Recommendations: Correction (SSI): Noted glucose ranged from 134-319 mg/dl on 8/27 and fasting glucose 146 mg/dl today. In reviewing chart, noted patient has been refusing Novolog correction. MD and bedside RNs please talk with patient about importance of glycemic control and encourage patient to take Novolog insulin while inpatient.   Thanks, Barnie Alderman, RN, MSN, CDE Diabetes Coordinator Inpatient Diabetes Program [phone] (Team Pager from [place] to [place]) [phone] ([facility] office) [phone] [facility] office) [phone] [facility] office)

## 2016-05-19 NOTE — Progress Notes (Signed)
PROGRESS NOTE                                                                                                                                                                                                             Patient Demographics:    Genisys Mackall, is a 75 y.o. female, DOB - Mar 16, 1941, [id number]  Admit date - 05/16/2016   Admitting Physician Reubin Milan, MD  Outpatient Primary MD for the patient is No primary care provider on file.  LOS - 3  Chief Complaint  Patient presents with  . Flank Pain       Brief Narrative     SORANGEL LOPRESTI is a 75 y.o. female with medical history significant of type 2 diabetes, hyperlipidemia, hypertension who comes to the ED referred by her PCP due to several weeks of nausea/vomiting, flank pain and ct scan abdomen/pelvis showing left hydronephrosis, multiple adenopathies and liver lesion suspicious for malignancy.   Per patient, she has been having nausea, emesis, diarrhea fatigue and malaise in her.  She denies fever, chills, but complains of night sweats, the past few days. She denies abdominal pain, melena or hematochezia. She denies GU symptoms. She denies chest pain, palpitations, dyspnea, pitting edema of the lower extremities.   In the ER, the patient was initially hypoglycemic, which has resolved with dextrose IV. She was initially hypothermic, but her temperature is normal now. She is in no acute distress.   ED Course: The patient received IV dextrose, IV antibiotics and warming blanket reporting relief. Workup shows mildly abnormal urinalysis,  lactic acid of 2.51 mmol/L, hypercalcemia of 14.5 mg/dL, BUN 53 mg/dL, creatinine 1.56 mg/dL. Normal white blood cell count and hemoglobin.   Subjective:    [name] today has, No headache, No chest pain, No abdominal pain - No Nausea, No new weakness tingling or numbness, No Cough - SOB.     Assessment  &  Plan :     1.Severe hypercalcemia induced nausea vomiting, dehydration, acute renal failure and weakness. Suspicious for underlying lymphoproliferative disorder based on CT scan findings, hypercalcemia treatment underway with IV fluids, Lasix, calcitonin and pamidronate which was given upon admission. We'll continue to hydrate and diurese. Monitor calcium levels which are improving. Continue supportive care. Pending PTH, PTH RP, 1, 25 and 25 hydroxy vitamin D levels.  2. Abdominal  liver neuropathy, possible liver mass, elevated calcium. Suspicious for lymphoproliferative disorder. Stable LDH, pending peripheral smear and CEA levels, CT chest unremarkable liver MRI showing infiltration of [place] muscle suspicious for sarcoma versus lymphoma along with diffuse lymphadenopathy in the abdomen. Oncology is following, IR on board for possible biopsy of either the lymph node or any other adjacent easily accessible abdominal structure for tissue pathology.  3. Left-sided hydronephrosis, likely due to abdominal adenopathy. Currently renal function is stable, urology following, likely outpatient stent placement.  4. Hypertension. Continue home hospital blocker. ACE inhibitor and diuretic due to renal failure.  5. ARF due to combination of #1, #2 and #3. Hydrate and monitor, avoid nephrotoxins, renal function improving.   6. Dyslipidemia. Resume home dose statin.   7. Mild Fluid OD - stop IVF, Lasix IV and monitor/  8. DM type II. complicated by hypoglycemia due to patient being on Actos in the presence of renal failure. D5W and monitor, sliding scale as needed.  Lab Results  Component Value Date   HGBA1C 6.4 (H) 05/17/2016   CBG (last 3)   Recent Labs  05/18/16 1615 05/18/16 2035 05/19/16 0738  GLUCAP 216* 231* 146*      Family Communication  :  None  Code Status :  Full  Diet : Heart Healthy Low Carb  Disposition Plan  :  Stay inpt  Consults  :  Onco, IR, Urology  Procedures  :      CT scan abdomen and pelvis with abdominal lymphadenopathy, possible liver mass, left-sided obstructive uropathy, possible cirrhosis.  MRI of the liver showing malignant alteration of the psoas muscle suspicious for sarcoma versus lymphoma along with diffuse liver neuropathy and left-sided obstructive uropathy.   DVT Prophylaxis  :  Heparin    Lab Results  Component Value Date   PLT 201 05/19/2016    Inpatient Medications  Scheduled Meds: . aspirin EC  81 mg Oral Daily  . atenolol  100 mg Oral QPM  . cefTRIAXone (ROCEPHIN)  IV  1 g Intravenous Q24H  . feeding supplement (ENSURE ENLIVE)  237 mL Oral BID BM  . [START ON 05/20/2016] heparin  5,000 Units Subcutaneous Q8H  . insulin aspart  0-5 Units Subcutaneous QHS  . insulin aspart  0-9 Units Subcutaneous TID WC  . rosuvastatin  10 mg Oral Once per day on Mon Wed Fri  . sodium chloride flush  3 mL Intravenous Q12H   Continuous Infusions:   PRN Meds:.LORazepam, ondansetron **OR** ondansetron (ZOFRAN) IV  Antibiotics  :    Anti-infectives    Start     Dose/Rate Route Frequency Ordered Stop   05/17/16 2000  cefTRIAXone (ROCEPHIN) 1 g in dextrose 5 % 50 mL IVPB     1 g 100 mL/hr over 30 Minutes Intravenous Every 24 hours 05/16/16 2158     05/16/16 1615  cefTRIAXone (ROCEPHIN) 1 g in dextrose 5 % 50 mL IVPB     1 g 100 mL/hr over 30 Minutes Intravenous  Once 05/16/16 1614 05/16/16 1950         Objective:   Vitals:   05/18/16 2238 05/19/16 0647 05/19/16 0739 05/19/16 0900  BP:  136/61 (!) 125/52 130/60  Pulse:  82 79   Resp:  18 18   Temp:  98.7 F (37.1 C) 97.8 F (36.6 C) 98.1 F (36.7 C)  TempSrc:  Oral Oral Oral  SpO2: 99% 100% 99% 99%  Weight:      Height:  Wt Readings from Last 3 Encounters:  05/17/16 79.5 kg (175 lb 4.3 oz)     Intake/Output Summary (Last 24 hours) at 05/19/16 1004 Last data filed at 05/19/16 0930  Gross per 24 hour  Intake          1970.83 ml  Output              175 ml   Net          1795.83 ml     Physical Exam  Awake Alert, Oriented X 3, No new F.N deficits, Normal affect [place].AT,PERRAL Supple Neck,No JVD, No cervical lymphadenopathy appriciated.  Symmetrical Chest wall movement, Good air movement bilaterally, CTAB RRR,No Gallops,Rubs or new Murmurs, No Parasternal Heave +ve B.Sounds, Abd Soft, No tenderness, No organomegaly appriciated, No rebound - guarding or rigidity. No Cyanosis, Clubbing or edema, No new Rash or bruise       Data Review:    CBC  Recent Labs Lab 05/16/16 1446 05/17/16 0707 05/18/16 0520 05/19/16 0521  WBC 10.1 5.6 5.4 5.0  HGB 12.6 10.4* 10.7* 10.7*  HCT 38.2 32.7* 33.2* 33.9*  PLT 365 231 218 201  MCV 84.0 84.5 85.3 85.6  MCH 27.7 26.9 27.5 27.0  MCHC 33.0 31.8 32.2 31.6  RDW 14.0 14.2 14.3 14.4  LYMPHSABS 1.5 0.6* 0.6* 0.6*  MONOABS 1.1* 0.8 0.6 0.6  EOSABS 0.1 0.1 0.2 0.2  BASOSABS 0.0 0.0 0.0 0.0    Chemistries   Recent Labs Lab 05/16/16 1446 05/16/16 2250 05/17/16 0707 05/18/16 0520 05/19/16 0521  NA 134* 134* 138 139 139  K 3.9 4.1 4.3 3.9 4.0  CL 100* 102 105 109 106  CO2 20* 24 25 23 24   GLUCOSE 34* 86 134* 144* 127*  BUN 53* 52* 42* 29* 19  CREATININE 1.56* 1.71* 1.58* 1.23* 1.25*  CALCIUM 14.5* 14.1* 12.3* 11.3* 10.1  AST 34  --  43* 30 29  ALT 15  --  13* 15 15  ALKPHOS 67  --  54 65 63  BILITOT 0.9  --  0.5 0.6 0.4   ------------------------------------------------------------------------------------------------------------------ No results for input(s): CHOL, HDL, LDLCALC, TRIG, CHOLHDL, LDLDIRECT in the last 72 hours.  Lab Results  Component Value Date   HGBA1C 6.4 (H) 05/17/2016   ------------------------------------------------------------------------------------------------------------------  Recent Labs  05/18/16 0910  TSH 0.884   ------------------------------------------------------------------------------------------------------------------ No results for  input(s): VITAMINB12, FOLATE, FERRITIN, TIBC, IRON, RETICCTPCT in the last 72 hours.  Coagulation profile  Recent Labs Lab 05/17/16 1352  INR 1.30    No results for input(s): DDIMER in the last 72 hours.  Cardiac Enzymes No results for input(s): CKMB, TROPONINI, MYOGLOBIN in the last 168 hours.  Invalid input(s): CK ------------------------------------------------------------------------------------------------------------------ No results found for: BNP  Micro Results Recent Results (from the past 240 hour(s))  Blood culture (routine x 2)     Status: None (Preliminary result)   Collection Time: 05/16/16  5:20 PM  Result Value Ref Range Status   Specimen Description BLOOD RIGHT HAND  Final   Special Requests BOTTLES DRAWN AEROBIC AND ANAEROBIC 5CC EA  Final   Culture NO GROWTH 2 DAYS  Final   Report Status PENDING  Incomplete  Blood culture (routine x 2)     Status: None (Preliminary result)   Collection Time: 05/16/16  5:40 PM  Result Value Ref Range Status   Specimen Description BLOOD LEFT HAND  Final   Special Requests IN PEDIATRIC BOTTLE 2CC  Final   Culture NO GROWTH 2 DAYS  Final   Report Status PENDING  Incomplete  Urine culture     Status: None   Collection Time: 05/16/16  7:04 PM  Result Value Ref Range Status   Specimen Description URINE, RANDOM  Final   Special Requests NONE  Final   Culture NO GROWTH  Final   Report Status 05/18/2016 FINAL  Final    Radiology Reports Ct Chest Wo Contrast  Result Date: 05/17/2016 CLINICAL DATA:  Evaluate adenopathy.  Chronic fatigue. EXAM: CT CHEST WITHOUT CONTRAST TECHNIQUE: Multidetector CT imaging of the chest was performed following the standard protocol without IV contrast. COMPARISON:  None. FINDINGS: Cardiovascular: Normal heart size. No pericardial effusion. Aortic atherosclerosis noted. Calcification within the RCA, LAD and left circumflex coronary artery noted. Mediastinum/Nodes: The trachea appears patent and is  midline. Normal appearance of the esophagus. No mediastinal or hilar adenopathy. There is no axillary or supraclavicular adenopathy. Lungs/Pleura: Small left pleural effusion is identified. There is no airspace consolidation or atelectasis. No suspicious pulmonary nodule or mass identified. Upper Abdomen: Morphologic features a liver compatible cirrhosis identified. Again noted is a focal area of low attenuation within the right lobe of liver measuring 2.9 cm, image 123 of series 2. Upper abdominal adenopathy is again noted as described on CT from 05/16/2016. See report from that date for more detailed description of changes in the upper abdomen. Musculoskeletal: No aggressive lytic or sclerotic bone lesions identified within the upper abdomen. IMPRESSION: 1. No evidence for thoracic adenopathy. 2. Aortic atherosclerosis and multi vessel coronary artery calcification. 3. Small left pleural effusion. 4. See report from CT abdomen pelvis dated 05/16/2016 regarding details of the upper abdomen. Electronically Signed   By: Kerby Moors M.D.   On: 05/17/2016 18:10   Ct Abdomen W Contrast  Result Date: 05/16/2016 CLINICAL DATA:  Nausea and vomiting for several weeks. EXAM: CT ABDOMEN WITH CONTRAST TECHNIQUE: Multidetector CT imaging of the abdomen was performed using the standard protocol following bolus administration of intravenous contrast. CONTRAST:  123mL ISOVUE-300 IOPAMIDOL (ISOVUE-300) INJECTION 61% COMPARISON:  None. FINDINGS: Lower chest: There is a small left pleural effusion identified. The lung bases appear clear. Hepatobiliary: The liver appears cirrhotic. The contour the liver is diffusely nodular and there is hypertrophy of the caudate lobe lateral segment of left lobe. Indeterminate structure in the right lobe of liver measures 3.5 x 1.6 cm and 46 HU. The gallbladder appears normal. No biliary dilatation. Pancreas: No inflammation or mass identified. Spleen: The spleen appears normal. The spleen  measures 10 cm in length. Adrenals/Urinary Tract: Normal appearance of the adrenal glands. The kidneys half an irregular contour bilaterally suggestive of chronic scarring. There is left-sided hydronephrosis. Stomach/Bowel: The stomach appears normal. The small bowel loops have a normal course and caliber. No pathologic dilatation of the colon. Vascular/Lymphatic: Aortic atherosclerosis is noted. No aneurysm. Extensive retroperitoneal adenopathy is identified. Nodal mass within the retroperitoneum measures 9.3 x 5.0 cm, image 35 of series 3. There are numerous soft tissue nodules identified within the left-sided perinephric space. Other: Mild soft tissue nodularity within the perineum is identified along with ascites. In the setting of cirrhosis the ascites is nonspecific. Musculoskeletal: No aggressive lytic or sclerotic bone lesions. IMPRESSION: 1. Extensive adenopathy is identified within the upper abdomen which is worrisome for either a metastatic adenopathy versus lymphoproliferative disorder such as lymphoma. Further assessment with PET-CT and tissue sampling is recommended. 2. There is evidence of soft tissue nodularity within the peritoneum as well as within the left perinephric fat. Findings  are suspicious for peritoneal spread of tumor 3. Ascites 4. Left-sided obstructive uropathy. Favor obstruction of the left ureter secondary to retroperitoneal adenopathy. 5. Morphologic features of the liver compatible with cirrhosis. 6. Indeterminate intermediate attenuating structure within right lobe of liver. A more definitive assessment of this structure could be obtained with a contrast enhanced MR of the liver. 7. Aortic atherosclerosis.  No aneurysm. Electronically Signed   By: Kerby Moors M.D.   On: 05/16/2016 13:23   Mr Liver W Wo Contrast  Result Date: 05/18/2016 CLINICAL DATA:  75 year old female with nausea, diarrhea more days. Abdominal adenopathy. Indeterminate renal lesion EXAM: MRI ABDOMEN WITHOUT  AND WITH CONTRAST TECHNIQUE: Multiplanar multisequence MR imaging of the abdomen was performed both before and after the administration of intravenous contrast. CONTRAST:  49mL MULTIHANCE GADOBENATE DIMEGLUMINE 529 MG/ML IV SOLN COMPARISON:  CT abdomen 05/16/2016 FINDINGS: Lower chest:  Small LEFT Hepatobiliary: The liver has a nodular contour with cirrhosis. There is enlargement of the caudate lobe and some contraction in the RIGHT hepatic lobe. Geographic lesion central RIGHT hepatic lobe measures 2.59 x 2.2 cm has high signal intensity T2 weighted imaging. This lesion has no post-contrast enhancement is felt to represent benign focus of scar inflammation. Pancreas: Normal pancreatic parenchymal intensity. No ductal dilatation or inflammation. Spleen: Normal spleen. Adrenals/urinary tract: Nodularity the perinephric space. Hydronephrosis on the LEFT secondary to entrapment of the LEFT ureter. Potential stone within the ureter on comparison CT Stomach/Bowel: Stomach and limited of the small bowel is unremarkable Vascular/Lymphatic: Abdominal aorta is normal caliber. There is extensive lymphadenopathy surrounding the aorta from the level of the adrenal glands to the bifurcation. This masslike adenopathy measures 6.35 by 0.4 cm axial dimension on 75, series 1201. There is adjacent involvement of the LEFT psoas muscle which is expanded and enhancing measuring 5.8 x 5.4 cm (image 79, series 120). This compares to the small 2.1 x 4.1 cm normal RIGHT psoas muscle There is enhancing nodularity the LEFT perinephric space inferior to the LEFT kidney. Musculoskeletal: Malignant lesion of the LEFT psoas muscle as described above. No aggressive osseous lesion identified. 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 2. Adjacent bulky periaortic adenopathy near completely surrounding the aorta. Findings are more suggestive of metastatic  adenopathy rather than retroperitoneal fibrosis. 3. 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 4. Cirrhotic liver with benign-appearing lesion in RIGHT hepatic. 5. Hydronephrosis on the LEFT secondary to entrapment of the LEFT ureter by the the retroperitoneal process. Potential stone within the ureter additionally. Electronically Signed   By: Suzy Bouchard M.D.   On: 05/18/2016 08:40   Dg Chest Port 1 View  Result Date: 05/17/2016 CLINICAL DATA:  Shortness of breath EXAM: PORTABLE CHEST 1 VIEW COMPARISON:  11/22/2009 FINDINGS: The heart size appears normal. There is aortic atherosclerosis noted. Chronic asymmetric elevation of right hemidiaphragm noted. No pleural effusion or edema. IMPRESSION: 1. No acute cardiopulmonary abnormalities. 2. Aortic atherosclerosis. Electronically Signed   By: Kerby Moors M.D.   On: 05/17/2016 12:11    Time Spent in minutes  30   SINGH,PRASHANT K M.D on 05/19/2016 at 10:04 AM  Between 7am to 7pm - Pager - [phone]  After 7pm go to [email or web address] - password [facility]  [facility] -  Office  [phone]

## 2016-05-19 NOTE — Consult Note (Signed)
Chief Complaint: Patient was seen in consultation today for retroperitoneal lymph node biopsy Chief Complaint  Patient presents with  . Flank Pain   at the request of Dr Burney Gauze  Referring Physician(s): Dr Burney Gauze Dr Lala Lund  Supervising Physician: Aletta Edouard  Patient Status: Inpatient  History of Present Illness: Grace Patel is a 75 y.o. female   Admitted with Abd pain and cough Hypercalcemia Previous renal cancer Hx Known liver lesion since 2002 Work up  Reveals retroperitoneal LAN and larger liver lesion CT 8/25: IMPRESSION: 1. Extensive adenopathy is identified within the upper abdomen which is worrisome for either a metastatic adenopathy versus lymphoproliferative disorder such as lymphoma. Further assessment with PET-CT and tissue sampling is recommended. 2. There is evidence of soft tissue nodularity within the peritoneum as well as within the left perinephric fat. 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 Ascites 4. Left-sided obstructive uropathy. Favor obstruction of the left ureter secondary to retroperitoneal adenopathy. 5. Morphologic features of the liver compatible with cirrhosis. 6. Indeterminate intermediate attenuating structure within right lobe of liver. A more definitive assessment of this structure could be obtained with a contrast enhanced MR of the liver. 7. Aortic atherosclerosis.  No aneurysm.  Request for tissue diagnosis per Dr Marin Olp Imaging reviewed with Dr Vernard Gambles and Dr Valla Leaver Retroperitoneal LAN bx   Past Medical History:  Diagnosis Date  . Diabetes mellitus without complication ([place])   . Hypercholesteremia   . Hypertension     Past Surgical History:  Procedure Laterality Date  . ABDOMINAL HYSTERECTOMY      Allergies: Codeine  Medications: Prior to Admission medications   Medication Sig Start Date End Date Taking? Authorizing Provider  aspirin EC 81 MG tablet  Take 81 mg by mouth daily.   Yes Historical Provider, MD  atenolol (TENORMIN) 100 MG tablet Take 100 mg by mouth every evening.   Yes Historical Provider, MD  glimepiride (AMARYL) 4 MG tablet Take 4 mg by mouth daily with breakfast.   Yes Historical Provider, MD  metFORMIN (GLUCOPHAGE) 500 MG tablet Take 1,000 mg by mouth 2 (two) times daily with a meal.   Yes Historical Provider, MD  pioglitazone (ACTOS) 30 MG tablet Take 30 mg by mouth daily.   Yes Historical Provider, MD  rosuvastatin (CRESTOR) 20 MG tablet Take 10 mg by mouth See admin instructions. Take on Mondays Wednesday and Friday mornings   Yes Historical Provider, MD  valsartan-hydrochlorothiazide (DIOVAN-HCT) 320-25 MG tablet Take 1 tablet by mouth daily.   Yes Historical Provider, MD     Family History  Problem Relation Age of Onset  . Heart attack Father   . Heart disease Sister   . Lung disease Brother     Asbestosis  . Heart attack Brother     Social History   Social History  . Marital status: Widowed    Spouse name: N/A  . Number of children: N/A  . Years of education: N/A   Social History Main Topics  . Smoking status: Never Smoker  . Smokeless tobacco: Never Used  . Alcohol use No  . Drug use: No  . Sexual activity: No   Other Topics Concern  . None   Social History Narrative  . None    Review of Systems: A 12 point ROS discussed and pertinent positives are indicated in the HPI above.  All other systems are negative.  Review of Systems  Constitutional: Positive for activity change, appetite change  and fatigue. Negative for fever.  Respiratory: Positive for cough. Negative for shortness of breath.   Cardiovascular: Negative for chest pain.  Gastrointestinal: Positive for abdominal pain.  Neurological: Positive for weakness.  Psychiatric/Behavioral: Negative for behavioral problems and confusion.    Vital Signs: BP (!) 125/52 (BP Location: Left Arm)   Pulse 79   Temp 97.8 F (36.6 C) (Oral)    Resp 18   Ht 4' 11.5" (1.511 m)   Wt 175 lb 4.3 oz (79.5 kg)   SpO2 99%   BMI 34.81 kg/m   Physical Exam  Constitutional: She is oriented to person, place, and time.  Cardiovascular: Normal rate and regular rhythm.   Pulmonary/Chest: Effort normal and breath sounds normal.  Abdominal: Soft. Bowel sounds are normal. There is tenderness.  Musculoskeletal: Normal range of motion.  Neurological: She is alert and oriented to person, place, and time.  Skin: Skin is warm and dry.  Psychiatric: She has a normal mood and affect. Her behavior is normal. Judgment and thought content normal.  Nursing note and vitals reviewed.   Mallampati Score:  MD Evaluation Airway: WNL Heart: WNL Abdomen: WNL Chest/ Lungs: WNL ASA  Classification: 3 Mallampati/Airway Score: One  Imaging: Ct Chest Wo Contrast  Result Date: 05/17/2016 CLINICAL DATA:  Evaluate adenopathy.  Chronic fatigue. EXAM: CT CHEST WITHOUT CONTRAST TECHNIQUE: Multidetector CT imaging of the chest was performed following the standard protocol without IV contrast. COMPARISON:  None. FINDINGS: Cardiovascular: Normal heart size. No pericardial effusion. Aortic atherosclerosis noted. Calcification within the RCA, LAD and left circumflex coronary artery noted. Mediastinum/Nodes: The trachea appears patent and is midline. Normal appearance of the esophagus. No mediastinal or hilar adenopathy. There is no axillary or supraclavicular adenopathy. Lungs/Pleura: Small left pleural effusion is identified. There is no airspace consolidation or atelectasis. No suspicious pulmonary nodule or mass identified. Upper Abdomen: Morphologic features a liver compatible cirrhosis identified. Again noted is a focal area of low attenuation within the right lobe of liver measuring 2.9 cm, image 123 of series 2. Upper abdominal adenopathy is again noted as described on CT from 05/16/2016. See report from that date for more detailed description of changes in the upper  abdomen. Musculoskeletal: No aggressive lytic or sclerotic bone lesions identified within the upper abdomen. IMPRESSION: 1. No evidence for thoracic adenopathy. 2. Aortic atherosclerosis and multi vessel coronary artery calcification. 3. Small left pleural effusion. 4. See report from CT abdomen pelvis dated 05/16/2016 regarding details of the upper abdomen. Electronically Signed   By: Kerby Moors M.D.   On: 05/17/2016 18:10   Ct Abdomen W Contrast  Result Date: 05/16/2016 CLINICAL DATA:  Nausea and vomiting for several weeks. EXAM: CT ABDOMEN WITH CONTRAST TECHNIQUE: Multidetector CT imaging of the abdomen was performed using the standard protocol following bolus administration of intravenous contrast. CONTRAST:  161mL ISOVUE-300 IOPAMIDOL (ISOVUE-300) INJECTION 61% COMPARISON:  None. FINDINGS: Lower chest: There is a small left pleural effusion identified. The lung bases appear clear. Hepatobiliary: The liver appears cirrhotic. The contour the liver is diffusely nodular and there is hypertrophy of the caudate lobe lateral segment of left lobe. Indeterminate structure in the right lobe of liver measures 3.5 x 1.6 cm and 46 HU. The gallbladder appears normal. No biliary dilatation. Pancreas: No inflammation or mass identified. Spleen: The spleen appears normal. The spleen measures 10 cm in length. Adrenals/Urinary Tract: Normal appearance of the adrenal glands. The kidneys half an irregular contour bilaterally suggestive of chronic scarring. There is left-sided hydronephrosis. Stomach/Bowel:  The stomach appears normal. The small bowel loops have a normal course and caliber. No pathologic dilatation of the colon. Vascular/Lymphatic: Aortic atherosclerosis is noted. No aneurysm. Extensive retroperitoneal adenopathy is identified. Nodal mass within the retroperitoneum measures 9.3 x 5.0 cm, image 35 of series 3. There are numerous soft tissue nodules identified within the left-sided perinephric space. Other:  Mild soft tissue nodularity within the perineum is identified along with ascites. In the setting of cirrhosis the ascites is nonspecific. Musculoskeletal: No aggressive lytic or sclerotic bone lesions. IMPRESSION: 1. Extensive adenopathy is identified within the upper abdomen which is worrisome for either a metastatic adenopathy versus lymphoproliferative disorder such as lymphoma. Further assessment with PET-CT and tissue sampling is recommended. 2. There is evidence of soft tissue nodularity within the peritoneum as well as within the left perinephric fat. 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 Ascites 4. Left-sided obstructive uropathy. Favor obstruction of the left ureter secondary to retroperitoneal adenopathy. 5. Morphologic features of the liver compatible with cirrhosis. 6. Indeterminate intermediate attenuating structure within right lobe of liver. A more definitive assessment of this structure could be obtained with a contrast enhanced MR of the liver. 7. Aortic atherosclerosis.  No aneurysm. Electronically Signed   By: Kerby Moors M.D.   On: 05/16/2016 13:23   Mr Liver W Wo Contrast  Result Date: 05/18/2016 CLINICAL DATA:  75 year old female with nausea, diarrhea more days. Abdominal adenopathy. Indeterminate renal lesion EXAM: MRI ABDOMEN WITHOUT AND WITH CONTRAST TECHNIQUE: Multiplanar multisequence MR imaging of the abdomen was performed both before and after the administration of intravenous contrast. CONTRAST:  26mL MULTIHANCE GADOBENATE DIMEGLUMINE 529 MG/ML IV SOLN COMPARISON:  CT abdomen 05/16/2016 FINDINGS: Lower chest:  Small LEFT Hepatobiliary: The liver has a nodular contour with cirrhosis. There is enlargement of the caudate lobe and some contraction in the RIGHT hepatic lobe. Geographic lesion central RIGHT hepatic lobe measures 2.59 x 2.2 cm has high signal intensity T2 weighted imaging. This lesion has no post-contrast enhancement is felt to represent benign focus of  scar inflammation. Pancreas: Normal pancreatic parenchymal intensity. No ductal dilatation or inflammation. Spleen: Normal spleen. Adrenals/urinary tract: Nodularity the perinephric space. Hydronephrosis on the LEFT secondary to entrapment of the LEFT ureter. Potential stone within the ureter on comparison CT Stomach/Bowel: Stomach and limited of the small bowel is unremarkable Vascular/Lymphatic: Abdominal aorta is normal caliber. There is extensive lymphadenopathy surrounding the aorta from the level of the adrenal glands to the bifurcation. This masslike adenopathy measures 6.35 by 0.4 cm axial dimension on 75, series 1201. There is adjacent involvement of the LEFT psoas muscle which is expanded and enhancing measuring 5.8 x 5.4 cm (image 79, series 120). This compares to the small 2.1 x 4.1 cm normal RIGHT psoas muscle There is enhancing nodularity the LEFT perinephric space inferior to the LEFT kidney. Musculoskeletal: Malignant lesion of the LEFT psoas muscle as described above. No aggressive osseous lesion identified. 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 2. Adjacent bulky periaortic adenopathy near completely surrounding the aorta. Findings are more suggestive of metastatic adenopathy rather than retroperitoneal fibrosis. 3. 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 4. Cirrhotic liver with benign-appearing lesion in RIGHT hepatic. 5. Hydronephrosis on the LEFT secondary to entrapment of the LEFT ureter by the the retroperitoneal process. Potential stone within the ureter additionally. Electronically Signed   By: Suzy Bouchard M.D.   On: 05/18/2016 08:40  Dg Chest Port 1 View  Result Date: 05/17/2016 CLINICAL DATA:  Shortness of breath EXAM: PORTABLE CHEST 1 VIEW COMPARISON:  11/22/2009 FINDINGS: The heart size appears normal. There is aortic atherosclerosis noted. Chronic asymmetric  elevation of right hemidiaphragm noted. No pleural effusion or edema. IMPRESSION: 1. No acute cardiopulmonary abnormalities. 2. Aortic atherosclerosis. Electronically Signed   By: Kerby Moors M.D.   On: 05/17/2016 12:11    Labs:  CBC:  Recent Labs  05/16/16 1446 05/17/16 0707 05/18/16 0520 05/19/16 0521  WBC 10.1 5.6 5.4 5.0  HGB 12.6 10.4* 10.7* 10.7*  HCT 38.2 32.7* 33.2* 33.9*  PLT 365 231 218 201    COAGS:  Recent Labs  05/17/16 1352  INR 1.30    BMP:  Recent Labs  05/16/16 2250 05/17/16 0707 05/18/16 0520 05/19/16 0521  NA 134* 138 139 139  K 4.1 4.3 3.9 4.0  CL 102 105 109 106  CO2 24 25 23 24   GLUCOSE 86 134* 144* 127*  BUN 52* 42* 29* 19  CALCIUM 14.1* 12.3* 11.3* 10.1  CREATININE 1.71* 1.58* 1.23* 1.25*  GFRNONAA 28* 31* 42* 41*  GFRAA 33* 36* 48* 48*    LIVER FUNCTION TESTS:  Recent Labs  05/16/16 1446 05/17/16 0707 05/18/16 0520 05/19/16 0521  BILITOT 0.9 0.5 0.6 0.4  AST 34 43* 30 29  ALT 15 13* 15 15  ALKPHOS 67 54 65 63  PROT 7.4 6.0* 6.3* 6.0*  ALBUMIN 3.6 2.7* 3.0* 2.9*    TUMOR MARKERS: No results for input(s): AFPTM, CEA, CA199, CHROMGRNA in the last 8760 hours.  Assessment and Plan:  Hx Renal Ca Hypercalcemia PR LAN Enlarging Liver lesion Now scheduled for retroperitoneal lymph node mass bx Risks and Benefits discussed with the patient including, but not limited to bleeding, infection, damage to adjacent structures or low yield requiring additional tests. All of the patient's questions were answered, patient is agreeable to proceed. Consent signed and in chart.  Thank you for this interesting consult.  I greatly enjoyed meeting SHAVONIA STOECKLEIN and look forward to participating in their care.  A copy of this report was sent to the requesting provider on this date.  Electronically Signed: Monia Sabal A 05/19/2016, 8:28 AM   I spent a total of 40 Minutes    in face to face in clinical consultation, greater than  50% of which was counseling/coordinating care for RP LAN bx

## 2016-05-19 NOTE — Procedures (Signed)
Interventional Radiology Procedure Note  Procedure:  CT guided core biopsy of left retroperitoneal mass  Complications:  None  Estimated Blood Loss: < 10 mL  Findings:  18 G core biopsy x 4 via 17 G needle of left psoas/retroperitoneal mass.  Samples submitted in formalin and saline.  Venetia Night. Kathlene Cote, M.D Pager:  [phone]

## 2016-05-19 NOTE — Progress Notes (Signed)
Initial Nutrition Assessment  DOCUMENTATION CODES:   Obesity unspecified  INTERVENTION:    Continue Ensure Enlive po BID, each supplement provides 350 kcal and 20 grams of protein  NUTRITION DIAGNOSIS:   Increased nutrient needs related to chronic illness as evidenced by estimated needs  GOAL:   Patient will meet greater than or equal to 90% of their needs  MONITOR:   PO intake, Supplement acceptance, Labs, Weight trends, Skin, I & O's  REASON FOR ASSESSMENT:   Malnutrition Screening Tool  ASSESSMENT:   75 y.o. Female with PMH significant of type 2 diabetes, hyperlipidemia, hypertension who comes to the ED referred by her PCP due to several weeks of nausea/vomiting, flank pain and ct scan abdomen/pelvis showing left hydronephrosis, multiple adenopathies and liver lesion suspicious for malignancy.   Pt currently in CT IMAGING. Per Malnutrition Screening Tool Report, pt eating poorly because of a decreased appetite. Pt also reported recent unintentional weight loss. PO intake variable at 0-80% per flowsheet records. Has Ensure Enlive oral nutrition supplements ordered BID. RD unable to complete Nutrition Focused Physical Exam at this time.  Diet Order:  Diet NPO time specified  Skin:  Reviewed, no issues  Last BM:  8/27  Height:   Ht Readings from Last 1 Encounters:  05/16/16 4' 11.5" (1.511 m)    Weight:   Wt Readings from Last 1 Encounters:  05/17/16 175 lb 4.3 oz (79.5 kg)    Ideal Body Weight:  44.6 kg  BMI:  Body mass index is 34.81 kg/m.  Estimated Nutritional Needs:   Kcal:  1800-2000  Protein:  100-110 gm  Fluid:  1.8-2.0 L  EDUCATION NEEDS:   No education needs identified at this time  Arthur Holms, RD, LDN Pager #: [phone] After-Hours Pager #: [phone]

## 2016-05-19 NOTE — Sedation Documentation (Signed)
Patient is resting comfortably. 

## 2016-05-20 LAB — CEA

## 2016-05-20 LAB — COMPREHENSIVE METABOLIC PANEL
ALBUMIN: 3 g/dL — AB (ref 3.5–5.0)
ALT: 17 U/L (ref 14–54)
ANION GAP: 10 (ref 5–15)
AST: 32 U/L (ref 15–41)
Alkaline Phosphatase: 67 U/L (ref 38–126)
BILIRUBIN TOTAL: 0.7 mg/dL (ref 0.3–1.2)
BUN: 18 mg/dL (ref 6–20)
CO2: 24 mmol/L (ref 22–32)
Calcium: 10.2 mg/dL (ref 8.9–10.3)
Chloride: 103 mmol/L (ref 101–111)
Creatinine, Ser: 1.09 mg/dL — ABNORMAL HIGH (ref 0.44–1.00)
GFR calc Af Amer: 56 mL/min — ABNORMAL LOW (ref >60)
GFR calc non Af Amer: 48 mL/min — ABNORMAL LOW (ref >60)
GLUCOSE: 135 mg/dL — AB (ref 65–99)
POTASSIUM: 4 mmol/L (ref 3.5–5.1)
Sodium: 137 mmol/L (ref 135–145)
TOTAL PROTEIN: 6.4 g/dL — AB (ref 6.5–8.1)

## 2016-05-20 LAB — CBC WITH DIFFERENTIAL/PLATELET
Basophils Absolute: 0 10*3/uL (ref 0.0–0.1)
Basophils Relative: 0 %
EOS PCT: 5 %
Eosinophils Absolute: 0.3 10*3/uL (ref 0.0–0.7)
HEMATOCRIT: 34.5 % — AB (ref 36.0–46.0)
Hemoglobin: 11.1 g/dL — ABNORMAL LOW (ref 12.0–15.0)
LYMPHS ABS: 0.5 10*3/uL — AB (ref 0.7–4.0)
LYMPHS PCT: 8 %
MCH: 27.2 pg (ref 26.0–34.0)
MCHC: 32.2 g/dL (ref 30.0–36.0)
MCV: 84.6 fL (ref 78.0–100.0)
MONO ABS: 0.7 10*3/uL (ref 0.1–1.0)
MONOS PCT: 10 %
NEUTROS ABS: 4.9 10*3/uL (ref 1.7–7.7)
Neutrophils Relative %: 77 %
PLATELETS: 225 10*3/uL (ref 150–400)
RBC: 4.08 MIL/uL (ref 3.87–5.11)
RDW: 14.2 % (ref 11.5–15.5)
WBC: 6.5 10*3/uL (ref 4.0–10.5)

## 2016-05-20 LAB — GLUCOSE, CAPILLARY
GLUCOSE-CAPILLARY: 144 mg/dL — AB (ref 65–99)
GLUCOSE-CAPILLARY: 206 mg/dL — AB (ref 65–99)
GLUCOSE-CAPILLARY: 168 mg/dL — AB (ref 65–99)
GLUCOSE-CAPILLARY: 273 mg/dL — AB (ref 65–99)

## 2016-05-20 LAB — VITAMIN D 25 HYDROXY (VIT D DEFICIENCY, FRACTURES): VIT D 25 HYDROXY: 18.2 ng/mL — AB (ref 30.0–100.0)

## 2016-05-20 LAB — PARATHYROID HORMONE, INTACT (NO CA): PTH: 14 pg/mL — AB (ref 15–65)

## 2016-05-20 LAB — CALCITRIOL (1,25 DI-OH VIT D): VIT D 1 25 DIHYDROXY: 129 pg/mL — AB (ref 19.9–79.3)

## 2016-05-20 NOTE — Progress Notes (Signed)
PROGRESS NOTE                                                                                                                                                                                                             Patient Demographics:    Grace Patel, is a 75 y.o. female, DOB - 01/26/1941, GQ:[id number]  Admit date - 05/16/2016   Admitting Physician Reubin Milan, MD  Outpatient Primary MD for the patient is No primary care provider on file.  LOS - 4  Chief Complaint  Patient presents with  . Flank Pain       Brief Narrative     Grace Patel is a 75 y.o. female with medical history significant of type 2 diabetes, hyperlipidemia, hypertension who comes to the ED referred by her PCP due to several weeks of nausea/vomiting, flank pain and ct scan abdomen/pelvis showing left hydronephrosis, multiple adenopathies and liver lesion suspicious for malignancy.   Per patient, she has been having nausea, emesis, diarrhea fatigue and malaise in her.  She denies fever, chills, but complains of night sweats, the past few days. She denies abdominal pain, melena or hematochezia. She denies GU symptoms. She denies chest pain, palpitations, dyspnea, pitting edema of the lower extremities.   Further workup showed hypercalcemia, possible liver mass, abdominal lymphadenopathy and retroperitoneal mass, left-sided hydronephrosis. IR, oncology and urology were consulted, hypercalcemia has resolved, she underwent her to peritoneal mass biopsy by IR on 05/19/2016 we await pathology.    Subjective:    Grace Patel today has, No headache, No chest pain, No abdominal pain - No Nausea, No new weakness tingling or numbness, No Cough - SOB.     Assessment  & Plan :     1.Severe hypercalcemia induced nausea vomiting, dehydration, acute renal failure and weakness. Suspicious for underlying lymphoproliferative disorder based on CT scan  findings, hypercalcemia has resolved after IV fluids, Lasix, calcitonin and pamidronate which was given upon admission. Monitor calcium levels. Continue supportive care.Suppressed PTH, TSH stable, pending PTH RP, 1, 25 and 25 hydroxy vitamin D levels.  2. Abdominal lymphadenopathy, possible liver mass, elevated calcium. Suspicious for lymphoproliferative disorder. Stable LDH, pending peripheral smear and CEA levels, CT chest unremarkable liver MRI showing infiltration of [place] muscle suspicious for sarcoma versus lymphoma along with diffuse lymphadenopathy in the abdomen. Oncology is following,  she underwent biopsy of retroperitoneal mass by IR on 05/19/2016 with pending pathology.  3. Left-sided hydronephrosis, likely due to abdominal adenopathy. Currently renal function is stable, urology following, likely outpatient stent placement.  4. Hypertension. Continue home hospital blocker. Hold ACE inhibitor and diuretic due to renal failure.  5. ARF due to combination of #1, #2 and #3. Hydrate and monitor, avoid nephrotoxins, renal function improving.   6. Dyslipidemia. Resume home dose statin.   7. Mild Fluid OD - stopped IVF, resolved with IV Lasix on 05/19/2016.  8. DM type II. complicated by hypoglycemia due to patient being on Actos in the presence of renal failure. D5W and monitor, sliding scale as needed.  Lab Results  Component Value Date   HGBA1C 6.4 (H) 05/17/2016   CBG (last 3)   Recent Labs  05/19/16 1635 05/19/16 2018 05/20/16 0729  GLUCAP 167* 198* 144*      Family Communication  :  None  Code Status :  Full  Diet : Heart Healthy Low Carb  Disposition Plan  :  Stay inpt  Consults  :  Onco, IR, Urology  Procedures  :    CT scan abdomen and pelvis with abdominal lymphadenopathy, possible liver mass, left-sided obstructive uropathy, possible cirrhosis.  MRI of the liver showing malignant alteration of the psoas muscle suspicious for sarcoma versus lymphoma along  with diffuse liver neuropathy and left-sided obstructive uropathy.  Retroperitoneal mass CT-guided biopsy by IR on 05/19/2016   DVT Prophylaxis  :  Heparin    Lab Results  Component Value Date   PLT 225 05/20/2016    Inpatient Medications  Scheduled Meds: . aspirin EC  81 mg Oral Daily  . atenolol  100 mg Oral QPM  . cefTRIAXone (ROCEPHIN)  IV  1 g Intravenous Q24H  . feeding supplement (ENSURE ENLIVE)  237 mL Oral BID BM  . heparin  5,000 Units Subcutaneous Q8H  . insulin aspart  0-5 Units Subcutaneous QHS  . insulin aspart  0-9 Units Subcutaneous TID WC  . rosuvastatin  10 mg Oral Once per day on Mon Wed Fri  . sodium chloride flush  3 mL Intravenous Q12H   Continuous Infusions:   PRN Meds:.LORazepam, ondansetron **OR** ondansetron (ZOFRAN) IV  Antibiotics  :    Anti-infectives    Start     Dose/Rate Route Frequency Ordered Stop   05/17/16 2000  cefTRIAXone (ROCEPHIN) 1 g in dextrose 5 % 50 mL IVPB     1 g 100 mL/hr over 30 Minutes Intravenous Every 24 hours 05/16/16 2158     05/16/16 1615  cefTRIAXone (ROCEPHIN) 1 g in dextrose 5 % 50 mL IVPB     1 g 100 mL/hr over 30 Minutes Intravenous  Once 05/16/16 1614 05/16/16 1950         Objective:   Vitals:   05/19/16 1634 05/19/16 2047 05/20/16 0542 05/20/16 0728  BP: 119/60 (!) 118/44 (!) 122/36 (!) 126/42  Pulse: 84 73 75 75  Resp: 17 18 16 17   Temp: 98.2 F (36.8 C) 98 F (36.7 C) 98.3 F (36.8 C) 98.1 F (36.7 C)  TempSrc: Oral Oral Oral Oral  SpO2: 97% 99% 95% 97%  Weight:      Height:        Wt Readings from Last 3 Encounters:  05/17/16 79.5 kg (175 lb 4.3 oz)     Intake/Output Summary (Last 24 hours) at 05/20/16 1036 Last data filed at 05/20/16 0930  Gross per 24 hour  Intake              533 ml  Output             1470 ml  Net             -937 ml     Physical Exam  Awake Alert, Oriented X 3, No new F.N deficits, Normal affect [place].AT,PERRAL Supple Neck,No JVD, No cervical  lymphadenopathy appriciated.  Symmetrical Chest wall movement, Good air movement bilaterally, CTAB RRR,No Gallops,Rubs or new Murmurs, No Parasternal Heave +ve B.Sounds, Abd Soft, No tenderness, No organomegaly appriciated, No rebound - guarding or rigidity. No Cyanosis, Clubbing or edema, No new Rash or bruise       Data Review:    CBC  Recent Labs Lab 05/16/16 1446 05/17/16 0707 05/18/16 0520 05/19/16 0521 05/20/16 0802  WBC 10.1 5.6 5.4 5.0 6.5  HGB 12.6 10.4* 10.7* 10.7* 11.1*  HCT 38.2 32.7* 33.2* 33.9* 34.5*  PLT 365 231 218 201 225  MCV 84.0 84.5 85.3 85.6 84.6  MCH 27.7 26.9 27.5 27.0 27.2  MCHC 33.0 31.8 32.2 31.6 32.2  RDW 14.0 14.2 14.3 14.4 14.2  LYMPHSABS 1.5 0.6* 0.6* 0.6* 0.5*  MONOABS 1.1* 0.8 0.6 0.6 0.7  EOSABS 0.1 0.1 0.2 0.2 0.3  BASOSABS 0.0 0.0 0.0 0.0 0.0    Chemistries   Recent Labs Lab 05/16/16 1446 05/16/16 2250 05/17/16 0707 05/18/16 0520 05/19/16 0521 05/20/16 0802  NA 134* 134* 138 139 139 137  K 3.9 4.1 4.3 3.9 4.0 4.0  CL 100* 102 105 109 106 103  CO2 20* 24 25 23 24 24   GLUCOSE 34* 86 134* 144* 127* 135*  BUN 53* 52* 42* 29* 19 18  CREATININE 1.56* 1.71* 1.58* 1.23* 1.25* 1.09*  CALCIUM 14.5* 14.1* 12.3* 11.3* 10.1 10.2  AST 34  --  43* 30 29 32  ALT 15  --  13* 15 15 17   ALKPHOS 67  --  54 65 63 67  BILITOT 0.9  --  0.5 0.6 0.4 0.7   ------------------------------------------------------------------------------------------------------------------ No results for input(s): CHOL, HDL, LDLCALC, TRIG, CHOLHDL, LDLDIRECT in the last 72 hours.  Lab Results  Component Value Date   HGBA1C 6.4 (H) 05/17/2016   ------------------------------------------------------------------------------------------------------------------  Recent Labs  05/18/16 0910  TSH 0.884   ------------------------------------------------------------------------------------------------------------------ No results for input(s): VITAMINB12, FOLATE,  FERRITIN, TIBC, IRON, RETICCTPCT in the last 72 hours.  Coagulation profile  Recent Labs Lab 05/17/16 1352  INR 1.30    No results for input(s): DDIMER in the last 72 hours.  Cardiac Enzymes No results for input(s): CKMB, TROPONINI, MYOGLOBIN in the last 168 hours.  Invalid input(s): CK ------------------------------------------------------------------------------------------------------------------ No results found for: BNP  Micro Results Recent Results (from the past 240 hour(s))  Blood culture (routine x 2)     Status: None (Preliminary result)   Collection Time: 05/16/16  5:20 PM  Result Value Ref Range Status   Specimen Description BLOOD RIGHT HAND  Final   Special Requests BOTTLES DRAWN AEROBIC AND ANAEROBIC 5CC EA  Final   Culture NO GROWTH 2 DAYS  Final   Report Status PENDING  Incomplete  Blood culture (routine x 2)     Status: None (Preliminary result)   Collection Time: 05/16/16  5:40 PM  Result Value Ref Range Status   Specimen Description BLOOD LEFT HAND  Final   Special Requests IN PEDIATRIC BOTTLE 2CC  Final   Culture NO GROWTH 2 DAYS  Final   Report  Status PENDING  Incomplete  Urine culture     Status: None   Collection Time: 05/16/16  7:04 PM  Result Value Ref Range Status   Specimen Description URINE, RANDOM  Final   Special Requests NONE  Final   Culture NO GROWTH  Final   Report Status 05/18/2016 FINAL  Final    Radiology Reports Ct Chest Wo Contrast  Result Date: 05/17/2016 CLINICAL DATA:  Evaluate adenopathy.  Chronic fatigue. EXAM: CT CHEST WITHOUT CONTRAST TECHNIQUE: Multidetector CT imaging of the chest was performed following the standard protocol without IV contrast. COMPARISON:  None. FINDINGS: Cardiovascular: Normal heart size. No pericardial effusion. Aortic atherosclerosis noted. Calcification within the RCA, LAD and left circumflex coronary artery noted. Mediastinum/Nodes: The trachea appears patent and is midline. Normal appearance of  the esophagus. No mediastinal or hilar adenopathy. There is no axillary or supraclavicular adenopathy. Lungs/Pleura: Small left pleural effusion is identified. There is no airspace consolidation or atelectasis. No suspicious pulmonary nodule or mass identified. Upper Abdomen: Morphologic features a liver compatible cirrhosis identified. Again noted is a focal area of low attenuation within the right lobe of liver measuring 2.9 cm, image 123 of series 2. Upper abdominal adenopathy is again noted as described on CT from 05/16/2016. See report from that date for more detailed description of changes in the upper abdomen. Musculoskeletal: No aggressive lytic or sclerotic bone lesions identified within the upper abdomen. IMPRESSION: 1. No evidence for thoracic adenopathy. 2. Aortic atherosclerosis and multi vessel coronary artery calcification. 3. Small left pleural effusion. 4. See report from CT abdomen pelvis dated 05/16/2016 regarding details of the upper abdomen. Electronically Signed   By: Kerby Moors M.D.   On: 05/17/2016 18:10   Ct Abdomen W Contrast  Result Date: 05/16/2016 CLINICAL DATA:  Nausea and vomiting for several weeks. EXAM: CT ABDOMEN WITH CONTRAST TECHNIQUE: Multidetector CT imaging of the abdomen was performed using the standard protocol following bolus administration of intravenous contrast. CONTRAST:  154mL ISOVUE-300 IOPAMIDOL (ISOVUE-300) INJECTION 61% COMPARISON:  None. FINDINGS: Lower chest: There is a small left pleural effusion identified. The lung bases appear clear. Hepatobiliary: The liver appears cirrhotic. The contour the liver is diffusely nodular and there is hypertrophy of the caudate lobe lateral segment of left lobe. Indeterminate structure in the right lobe of liver measures 3.5 x 1.6 cm and 46 HU. The gallbladder appears normal. No biliary dilatation. Pancreas: No inflammation or mass identified. Spleen: The spleen appears normal. The spleen measures 10 cm in length.  Adrenals/Urinary Tract: Normal appearance of the adrenal glands. The kidneys half an irregular contour bilaterally suggestive of chronic scarring. There is left-sided hydronephrosis. Stomach/Bowel: The stomach appears normal. The small bowel loops have a normal course and caliber. No pathologic dilatation of the colon. Vascular/Lymphatic: Aortic atherosclerosis is noted. No aneurysm. Extensive retroperitoneal adenopathy is identified. Nodal mass within the retroperitoneum measures 9.3 x 5.0 cm, image 35 of series 3. There are numerous soft tissue nodules identified within the left-sided perinephric space. Other: Mild soft tissue nodularity within the perineum is identified along with ascites. In the setting of cirrhosis the ascites is nonspecific. Musculoskeletal: No aggressive lytic or sclerotic bone lesions. IMPRESSION: 1. Extensive adenopathy is identified within the upper abdomen which is worrisome for either a metastatic adenopathy versus lymphoproliferative disorder such as lymphoma. Further assessment with PET-CT and tissue sampling is recommended. 2. There is evidence of soft tissue nodularity within the peritoneum as well as within the left perinephric fat. Findings are suspicious for peritoneal  spread of tumor 3. Ascites 4. Left-sided obstructive uropathy. Favor obstruction of the left ureter secondary to retroperitoneal adenopathy. 5. Morphologic features of the liver compatible with cirrhosis. 6. Indeterminate intermediate attenuating structure within right lobe of liver. A more definitive assessment of this structure could be obtained with a contrast enhanced MR of the liver. 7. Aortic atherosclerosis.  No aneurysm. Electronically Signed   By: Kerby Moors M.D.   On: 05/16/2016 13:23   Ct Guided Needle Placement  Result Date: 05/19/2016 CLINICAL DATA:  Left-sided retroperitoneal mass involving the psoas muscle with associated para-aortic tumor extension versus lymphadenopathy. EXAM: CT GUIDED CORE  BIOPSY OF RETROPERITONEAL ABDOMINAL MASS ANESTHESIA/SEDATION: 1.0  Mg IV Versed; 50 mcg IV Fentanyl Total Moderate Sedation Time: 15 minutes. PROCEDURE: The procedure risks, benefits, and alternatives were explained to the patient. Questions regarding the procedure were encouraged and answered. The patient understands and consents to the procedure. The left translumbar region was prepped with chlorhexidine in a sterile fashion, and a sterile drape was applied covering the operative field. A sterile gown and sterile gloves were used for the procedure. Local anesthesia was provided with 1% Lidocaine. CT was performed through the abdomen in a prone position. Under CT guidance, a 17 gauge needle was advanced to the level of the left psoas muscle as well as adjacent retroperitoneal lymphadenopathy. A total of 4 separate 18 gauge core biopsy samples were obtained and submitted in both formalin and saline. After the procedure the outer needle was removed and additional CT images performed. COMPLICATIONS: None FINDINGS: Again noted is mass enlargement of the left psoas muscle with adjacent retroperitoneal lymphadenopathy. Core biopsy samples were made of the infiltrated psoas muscle itself as well as lymph nodes just beyond the anterior margin of the muscle. Solid tissue was obtained. IMPRESSION: CT-guided core biopsy performed of left retroperitoneal mass involving the psoas muscle and adjacent retroperitoneal lymphadenopathy. Electronically Signed   By: Aletta Edouard M.D.   On: 05/19/2016 14:45   Mr Liver W Wo Contrast  Result Date: 05/18/2016 CLINICAL DATA:  75 year old female with nausea, diarrhea more days. Abdominal adenopathy. Indeterminate renal lesion EXAM: MRI ABDOMEN WITHOUT AND WITH CONTRAST TECHNIQUE: Multiplanar multisequence MR imaging of the abdomen was performed both before and after the administration of intravenous contrast. CONTRAST:  57mL MULTIHANCE GADOBENATE DIMEGLUMINE 529 MG/ML IV SOLN  COMPARISON:  CT abdomen 05/16/2016 FINDINGS: Lower chest:  Small LEFT Hepatobiliary: The liver has a nodular contour with cirrhosis. There is enlargement of the caudate lobe and some contraction in the RIGHT hepatic lobe. Geographic lesion central RIGHT hepatic lobe measures 2.59 x 2.2 cm has high signal intensity T2 weighted imaging. This lesion has no post-contrast enhancement is felt to represent benign focus of scar inflammation. Pancreas: Normal pancreatic parenchymal intensity. No ductal dilatation or inflammation. Spleen: Normal spleen. Adrenals/urinary tract: Nodularity the perinephric space. Hydronephrosis on the LEFT secondary to entrapment of the LEFT ureter. Potential stone within the ureter on comparison CT Stomach/Bowel: Stomach and limited of the small bowel is unremarkable Vascular/Lymphatic: Abdominal aorta is normal caliber. There is extensive lymphadenopathy surrounding the aorta from the level of the adrenal glands to the bifurcation. This masslike adenopathy measures 6.35 by 0.4 cm axial dimension on 75, series 1201. There is adjacent involvement of the LEFT psoas muscle which is expanded and enhancing measuring 5.8 x 5.4 cm (image 79, series 120). This compares to the small 2.1 x 4.1 cm normal RIGHT psoas muscle There is enhancing nodularity the LEFT perinephric space inferior to  the LEFT kidney. Musculoskeletal: Malignant lesion of the LEFT psoas muscle as described above. No aggressive osseous lesion identified. 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 2. Adjacent bulky periaortic adenopathy near completely surrounding the aorta. Findings are more suggestive of metastatic adenopathy rather than retroperitoneal fibrosis. 3. 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 4. Cirrhotic liver with benign-appearing lesion in RIGHT hepatic. 5. Hydronephrosis on the LEFT secondary to  entrapment of the LEFT ureter by the the retroperitoneal process. Potential stone within the ureter additionally. Electronically Signed   By: Suzy Bouchard M.D.   On: 05/18/2016 08:40   Dg Chest Port 1 View  Result Date: 05/19/2016 CLINICAL DATA:  Shortness of breath EXAM: PORTABLE CHEST 1 VIEW COMPARISON:  Multiple exams, including 05/17/2016 FINDINGS: Atherosclerotic aortic arch. Mild blunting of the left lateral costophrenic angle. Heart size within normal limits for projection. The lungs appear otherwise clear. IMPRESSION: 1. Small left pleural effusion causing blunting of the left lateral costophrenic angle. 2. Atherosclerotic aortic arch. Electronically Signed   By: Van Clines M.D.   On: 05/19/2016 10:25   Dg Chest Port 1 View  Result Date: 05/17/2016 CLINICAL DATA:  Shortness of breath EXAM: PORTABLE CHEST 1 VIEW COMPARISON:  11/22/2009 FINDINGS: The heart size appears normal. There is aortic atherosclerosis noted. Chronic asymmetric elevation of right hemidiaphragm noted. No pleural effusion or edema. IMPRESSION: 1. No acute cardiopulmonary abnormalities. 2. Aortic atherosclerosis. Electronically Signed   By: Kerby Moors M.D.   On: 05/17/2016 12:11    Time Spent in minutes  30   SINGH,PRASHANT K M.D on 05/20/2016 at 10:36 AM  Between 7am to 7pm - Pager - [phone]  After 7pm go to [email or web address] - password [facility]  [facility] -  Office  [phone]

## 2016-05-21 LAB — COMPREHENSIVE METABOLIC PANEL
ALBUMIN: 2.8 g/dL — AB (ref 3.5–5.0)
ALT: 16 U/L (ref 14–54)
AST: 31 U/L (ref 15–41)
Alkaline Phosphatase: 64 U/L (ref 38–126)
Anion gap: 14 (ref 5–15)
BUN: 21 mg/dL — AB (ref 6–20)
CHLORIDE: 100 mmol/L — AB (ref 101–111)
CO2: 22 mmol/L (ref 22–32)
CREATININE: 1.14 mg/dL — AB (ref 0.44–1.00)
Calcium: 10.4 mg/dL — ABNORMAL HIGH (ref 8.9–10.3)
GFR calc Af Amer: 53 mL/min — ABNORMAL LOW (ref >60)
GFR, EST NON AFRICAN AMERICAN: 46 mL/min — AB (ref >60)
GLUCOSE: 145 mg/dL — AB (ref 65–99)
Potassium: 3.9 mmol/L (ref 3.5–5.1)
Sodium: 136 mmol/L (ref 135–145)
Total Bilirubin: 0.7 mg/dL (ref 0.3–1.2)
Total Protein: 5.8 g/dL — ABNORMAL LOW (ref 6.5–8.1)

## 2016-05-21 LAB — CBC WITH DIFFERENTIAL/PLATELET
BASOS ABS: 0 10*3/uL (ref 0.0–0.1)
BASOS PCT: 1 %
EOS PCT: 4 %
Eosinophils Absolute: 0.2 10*3/uL (ref 0.0–0.7)
HCT: 33.3 % — ABNORMAL LOW (ref 36.0–46.0)
Hemoglobin: 10.8 g/dL — ABNORMAL LOW (ref 12.0–15.0)
LYMPHS PCT: 11 %
Lymphs Abs: 0.6 10*3/uL — ABNORMAL LOW (ref 0.7–4.0)
MCH: 26.9 pg (ref 26.0–34.0)
MCHC: 32.4 g/dL (ref 30.0–36.0)
MCV: 82.8 fL (ref 78.0–100.0)
Monocytes Absolute: 0.6 10*3/uL (ref 0.1–1.0)
Monocytes Relative: 10 %
NEUTROS ABS: 4.1 10*3/uL (ref 1.7–7.7)
Neutrophils Relative %: 74 %
PLATELETS: 164 10*3/uL (ref 150–400)
RBC: 4.02 MIL/uL (ref 3.87–5.11)
RDW: 14.1 % (ref 11.5–15.5)
WBC: 5.6 10*3/uL (ref 4.0–10.5)

## 2016-05-21 LAB — GLUCOSE, CAPILLARY
GLUCOSE-CAPILLARY: 216 mg/dL — AB (ref 65–99)
Glucose-Capillary: 154 mg/dL — ABNORMAL HIGH (ref 65–99)
Glucose-Capillary: 217 mg/dL — ABNORMAL HIGH (ref 65–99)
Glucose-Capillary: 166 mg/dL — ABNORMAL HIGH (ref 65–99)

## 2016-05-21 LAB — CULTURE, BLOOD (ROUTINE X 2)
CULTURE: NO GROWTH
Culture: NO GROWTH

## 2016-05-21 MED ORDER — ZOLPIDEM TARTRATE 5 MG PO TABS
5.0000 mg | ORAL_TABLET | Freq: Once | ORAL | Status: AC
  Administered 2016-05-21: 5 mg via ORAL
  Filled 2016-05-21: qty 1

## 2016-05-21 NOTE — Progress Notes (Signed)
PROGRESS NOTE                                                                                                                                                                                                            Chief Complaint  Patient presents with  . Flank Pain       Brief Narrative      75 y.o. ?  type 2 diabetes,  hyperlipidemia,  hypertension  Admitted from ED  due to several weeks of nausea/vomiting, flank pain and ct scan abdomen/pelvis showing left hydronephrosis, multiple adenopathies and liver lesion suspicious for malignancy.   + nausea, emesis, diarrhea fatigue and malaise She denies fever, chills, but complains of night sweats, the past few days.  She denies abdominal pain, melena or hematochezia. She denies GU symptoms. She denies chest pain, palpitations, dyspnea, pitting edema of the lower extremities.   Further workup showed hypercalcemia, possible liver mass, abdominal lymphadenopathy and retroperitoneal mass, left-sided hydronephrosis. IR, oncology and urology were consulted, hypercalcemia has resolved, she underwent her to peritoneal mass biopsy by IR on 05/19/2016 we await pathology.    Subjective:   Fair Seems a little worried No other issues No pain no sob No difficulty breathign Cough is improved   Assessment  & Plan :     1.Severe hypercalcemia induced nausea vomiting, dehydration, acute renal failure and weakness.  Suspicious for underlying lymphoproliferative disorder based on CT scan findings-I called pathology 8/30 and pathology will be read in am  hypercalcemia has resolved after IV fluids, Lasix, calcitonin and pamidronate which was given upon admission. .  Suppressed PTH of 14,  TSH stable,  In process since 8/27 PTH RP, 1, 25 and 25 hydroxy vitamin D levels was 129 from 8./27 CEA wnl   2. Abdominal lymphadenopathy, possible liver mass, elevated calcium.    Suspicious for lymphoproliferative disorder.  Stable LDH, pending peripheral smear   CT chest unremarkable liver MRI showing infiltration of [place] muscle suspicious for sarcoma versus lymphoma along with diffuse lymphadenopathy in the abdomen. I gave a report to daughter and she is disucssion giwht family options-might seek care in [place] at [facility]  Oncology is following, she underwent biopsy of retroperitoneal mass by IR on 05/19/2016 with pending pathology.  3. Left-sided hydronephrosis, likely due to abdominal adenopathy. Currently renal function is stable, urology dr.  Budzyn consulted 8/26 ? likely outpatient stent placement dependant on labs etc.  4. Hypertension. Continue home hospital blocker. Hold ACE inhibitor and diuretic due to renal failure.  5. ARF due to combination of #1, #2 and #3. Hydrate and monitor, avoid nephrotoxins, renal function improving.   6. Dyslipidemia. Resume home dose statin.   7. Mild Fluid OD - stopped IVF, resolved with IV Lasix on 05/19/2016.  8. DM type II. complicated by hypoglycemia due to patient being on Actos in the presence of renal failure. D5W and monitor, sliding scale as needed. Sugar trends wnl  Lab Results  Component Value Date   HGBA1C 6.4 (H) 05/17/2016   CBG (last 3)   Recent Labs  05/20/16 2116 05/21/16 0740 05/21/16 1139  GLUCAP 273* 154* 217*      Family Communication  :  None  Code Status :  Full  Diet : Heart Healthy Low Carb  Disposition Plan  :  Stay inpt, spoke with daugther jennifer  Consults  :  Onco, IR, Urology  Procedures  :    CT scan abdomen and pelvis with abdominal lymphadenopathy, possible liver mass, left-sided obstructive uropathy, possible cirrhosis.  MRI of the liver showing malignant alteration of the psoas muscle suspicious for sarcoma versus lymphoma along with diffuse liver neuropathy and left-sided obstructive uropathy.  Retroperitoneal mass CT-guided biopsy by IR on  05/19/2016   DVT Prophylaxis  :  Heparin    Lab Results  Component Value Date   PLT 164 05/21/2016    Inpatient Medications  Scheduled Meds: . aspirin EC  81 mg Oral Daily  . atenolol  100 mg Oral QPM  . cefTRIAXone (ROCEPHIN)  IV  1 g Intravenous Q24H  . feeding supplement (ENSURE ENLIVE)  237 mL Oral BID BM  . heparin  5,000 Units Subcutaneous Q8H  . insulin aspart  0-5 Units Subcutaneous QHS  . insulin aspart  0-9 Units Subcutaneous TID WC  . rosuvastatin  10 mg Oral Once per day on Mon Wed Fri  . sodium chloride flush  3 mL Intravenous Q12H   Continuous Infusions:   PRN Meds:.LORazepam, ondansetron **OR** ondansetron (ZOFRAN) IV  Antibiotics  :    Anti-infectives    Start     Dose/Rate Route Frequency Ordered Stop   05/17/16 2000  cefTRIAXone (ROCEPHIN) 1 g in dextrose 5 % 50 mL IVPB     1 g 100 mL/hr over 30 Minutes Intravenous Every 24 hours 05/16/16 2158     05/16/16 1615  cefTRIAXone (ROCEPHIN) 1 g in dextrose 5 % 50 mL IVPB     1 g 100 mL/hr over 30 Minutes Intravenous  Once 05/16/16 1614 05/16/16 1950         Objective:   Vitals:   05/20/16 1737 05/20/16 2052 05/21/16 0517 05/21/16 0935  BP: (!) 132/54 (!) 118/56 125/65 (!) 147/54  Pulse: 81 80 77 80  Resp: 17  20 18   Temp: 98.1 F (36.7 C) 97.3 F (36.3 C) 97.7 F (36.5 C) 98.6 F (37 C)  TempSrc: Oral   Oral  SpO2: 96% 97% 95% 97%  Weight:  83 kg (182 lb 15.7 oz)    Height:        Wt Readings from Last 3 Encounters:  05/20/16 83 kg (182 lb 15.7 oz)     Intake/Output Summary (Last 24 hours) at 05/21/16 1640 Last data filed at 05/21/16 1330  Gross per 24 hour  Intake  720 ml  Output                0 ml  Net              720 ml     Physical Exam  Awake Alert, Oriented X 3, No new F.N deficits, Normal affect [place].AT,PERRAL Supple Neck,No JVD, No cervical lymphadenopathy appriciated.  Symmetrical Chest wall movement, Good air movement bilaterally, CTAB RRR,No Gallops,Rubs  or new Murmurs, No Parasternal Heave +ve B.Sounds, Abd Soft, No tenderness, No organomegaly appriciated, No rebound - guarding or rigidity. No Cyanosis, Clubbing or edema, No new Rash or bruise       Data Review:    CBC  Recent Labs Lab 05/17/16 0707 05/18/16 0520 05/19/16 0521 05/20/16 0802 05/21/16 0416  WBC 5.6 5.4 5.0 6.5 5.6  HGB 10.4* 10.7* 10.7* 11.1* 10.8*  HCT 32.7* 33.2* 33.9* 34.5* 33.3*  PLT 231 218 201 225 164  MCV 84.5 85.3 85.6 84.6 82.8  MCH 26.9 27.5 27.0 27.2 26.9  MCHC 31.8 32.2 31.6 32.2 32.4  RDW 14.2 14.3 14.4 14.2 14.1  LYMPHSABS 0.6* 0.6* 0.6* 0.5* 0.6*  MONOABS 0.8 0.6 0.6 0.7 0.6  EOSABS 0.1 0.2 0.2 0.3 0.2  BASOSABS 0.0 0.0 0.0 0.0 0.0    Chemistries   Recent Labs Lab 05/17/16 0707 05/18/16 0520 05/19/16 0521 05/20/16 0802 05/21/16 0416  NA 138 139 139 137 136  K 4.3 3.9 4.0 4.0 3.9  CL 105 109 106 103 100*  CO2 25 23 24 24 22   GLUCOSE 134* 144* 127* 135* 145*  BUN 42* 29* 19 18 21*  CREATININE 1.58* 1.23* 1.25* 1.09* 1.14*  CALCIUM 12.3* 11.3* 10.1 10.2 10.4*  AST 43* 30 29 32 31  ALT 13* 15 15 17 16   ALKPHOS 54 65 63 67 64  BILITOT 0.5 0.6 0.4 0.7 0.7   ------------------------------------------------------------------------------------------------------------------ No results for input(s): CHOL, HDL, LDLCALC, TRIG, CHOLHDL, LDLDIRECT in the last 72 hours.  Lab Results  Component Value Date   HGBA1C 6.4 (H) 05/17/2016   ------------------------------------------------------------------------------------------------------------------ No results for input(s): TSH, T4TOTAL, T3FREE, THYROIDAB in the last 72 hours.  Invalid input(s): FREET3 ------------------------------------------------------------------------------------------------------------------ No results for input(s): VITAMINB12, FOLATE, FERRITIN, TIBC, IRON, RETICCTPCT in the last 72 hours.  Coagulation profile  Recent Labs Lab 05/17/16 1352  INR 1.30    No  results for input(s): DDIMER in the last 72 hours.  Cardiac Enzymes No results for input(s): CKMB, TROPONINI, MYOGLOBIN in the last 168 hours.  Invalid input(s): CK ------------------------------------------------------------------------------------------------------------------ No results found for: BNP  Micro Results Recent Results (from the past 240 hour(s))  Blood culture (routine x 2)     Status: None   Collection Time: 05/16/16  5:20 PM  Result Value Ref Range Status   Specimen Description BLOOD RIGHT HAND  Final   Special Requests BOTTLES DRAWN AEROBIC AND ANAEROBIC 5CC EA  Final   Culture NO GROWTH 5 DAYS  Final   Report Status 05/21/2016 FINAL  Final  Blood culture (routine x 2)     Status: None   Collection Time: 05/16/16  5:40 PM  Result Value Ref Range Status   Specimen Description BLOOD LEFT HAND  Final   Special Requests IN PEDIATRIC BOTTLE [facility]  Final   Culture NO GROWTH 5 DAYS  Final   Report Status 05/21/2016 FINAL  Final  Urine culture     Status: None   Collection Time: 05/16/16  7:04 PM  Result Value Ref Range Status  Specimen Description URINE, RANDOM  Final   Special Requests NONE  Final   Culture NO GROWTH  Final   Report Status 05/18/2016 FINAL  Final    Radiology Reports Ct Chest Wo Contrast  Result Date: 05/17/2016 CLINICAL DATA:  Evaluate adenopathy.  Chronic fatigue. EXAM: CT CHEST WITHOUT CONTRAST TECHNIQUE: Multidetector CT imaging of the chest was performed following the standard protocol without IV contrast. COMPARISON:  None. FINDINGS: Cardiovascular: Normal heart size. No pericardial effusion. Aortic atherosclerosis noted. Calcification within the RCA, LAD and left circumflex coronary artery noted. Mediastinum/Nodes: The trachea appears patent and is midline. Normal appearance of the esophagus. No mediastinal or hilar adenopathy. There is no axillary or supraclavicular adenopathy. Lungs/Pleura: Small left pleural effusion is identified. There is  no airspace consolidation or atelectasis. No suspicious pulmonary nodule or mass identified. Upper Abdomen: Morphologic features a liver compatible cirrhosis identified. Again noted is a focal area of low attenuation within the right lobe of liver measuring 2.9 cm, image 123 of series 2. Upper abdominal adenopathy is again noted as described on CT from 05/16/2016. See report from that date for more detailed description of changes in the upper abdomen. Musculoskeletal: No aggressive lytic or sclerotic bone lesions identified within the upper abdomen. IMPRESSION: 1. No evidence for thoracic adenopathy. 2. Aortic atherosclerosis and multi vessel coronary artery calcification. 3. Small left pleural effusion. 4. See report from CT abdomen pelvis dated 05/16/2016 regarding details of the upper abdomen. Electronically Signed   By: Kerby Moors M.D.   On: 05/17/2016 18:10   Ct Abdomen W Contrast  Result Date: 05/16/2016 CLINICAL DATA:  Nausea and vomiting for several weeks. EXAM: CT ABDOMEN WITH CONTRAST TECHNIQUE: Multidetector CT imaging of the abdomen was performed using the standard protocol following bolus administration of intravenous contrast. CONTRAST:  140mL ISOVUE-300 IOPAMIDOL (ISOVUE-300) INJECTION 61% COMPARISON:  None. FINDINGS: Lower chest: There is a small left pleural effusion identified. The lung bases appear clear. Hepatobiliary: The liver appears cirrhotic. The contour the liver is diffusely nodular and there is hypertrophy of the caudate lobe lateral segment of left lobe. Indeterminate structure in the right lobe of liver measures 3.5 x 1.6 cm and 46 HU. The gallbladder appears normal. No biliary dilatation. Pancreas: No inflammation or mass identified. Spleen: The spleen appears normal. The spleen measures 10 cm in length. Adrenals/Urinary Tract: Normal appearance of the adrenal glands. The kidneys half an irregular contour bilaterally suggestive of chronic scarring. There is left-sided  hydronephrosis. Stomach/Bowel: The stomach appears normal. The small bowel loops have a normal course and caliber. No pathologic dilatation of the colon. Vascular/Lymphatic: Aortic atherosclerosis is noted. No aneurysm. Extensive retroperitoneal adenopathy is identified. Nodal mass within the retroperitoneum measures 9.3 x 5.0 cm, image 35 of series 3. There are numerous soft tissue nodules identified within the left-sided perinephric space. Other: Mild soft tissue nodularity within the perineum is identified along with ascites. In the setting of cirrhosis the ascites is nonspecific. Musculoskeletal: No aggressive lytic or sclerotic bone lesions. IMPRESSION: 1. Extensive adenopathy is identified within the upper abdomen which is worrisome for either a metastatic adenopathy versus lymphoproliferative disorder such as lymphoma. Further assessment with PET-CT and tissue sampling is recommended. 2. There is evidence of soft tissue nodularity within the peritoneum as well as within the left perinephric fat. 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 Ascites 4. Left-sided obstructive uropathy. Favor obstruction of the left ureter secondary to retroperitoneal adenopathy. 5. Morphologic features of the liver compatible with cirrhosis. 6.  Indeterminate intermediate attenuating structure within right lobe of liver. A more definitive assessment of this structure could be obtained with a contrast enhanced MR of the liver. 7. Aortic atherosclerosis.  No aneurysm. Electronically Signed   By: Kerby Moors M.D.   On: 05/16/2016 13:23   Ct Guided Needle Placement  Result Date: 05/19/2016 CLINICAL DATA:  Left-sided retroperitoneal mass involving the psoas muscle with associated para-aortic tumor extension versus lymphadenopathy. EXAM: CT GUIDED CORE BIOPSY OF RETROPERITONEAL ABDOMINAL MASS ANESTHESIA/SEDATION: 1.0  Mg IV Versed; 50 mcg IV Fentanyl Total Moderate Sedation Time: 15 minutes. PROCEDURE: The procedure  risks, benefits, and alternatives were explained to the patient. Questions regarding the procedure were encouraged and answered. The patient understands and consents to the procedure. The left translumbar region was prepped with chlorhexidine in a sterile fashion, and a sterile drape was applied covering the operative field. A sterile gown and sterile gloves were used for the procedure. Local anesthesia was provided with 1% Lidocaine. CT was performed through the abdomen in a prone position. Under CT guidance, a 17 gauge needle was advanced to the level of the left psoas muscle as well as adjacent retroperitoneal lymphadenopathy. A total of 4 separate 18 gauge core biopsy samples were obtained and submitted in both formalin and saline. After the procedure the outer needle was removed and additional CT images performed. COMPLICATIONS: None FINDINGS: Again noted is mass enlargement of the left psoas muscle with adjacent retroperitoneal lymphadenopathy. Core biopsy samples were made of the infiltrated psoas muscle itself as well as lymph nodes just beyond the anterior margin of the muscle. Solid tissue was obtained. IMPRESSION: CT-guided core biopsy performed of left retroperitoneal mass involving the psoas muscle and adjacent retroperitoneal lymphadenopathy. Electronically Signed   By: Aletta Edouard M.D.   On: 05/19/2016 14:45   Mr Liver W Wo Contrast  Result Date: 05/18/2016 CLINICAL DATA:  75 year old female with nausea, diarrhea more days. Abdominal adenopathy. Indeterminate renal lesion EXAM: MRI ABDOMEN WITHOUT AND WITH CONTRAST TECHNIQUE: Multiplanar multisequence MR imaging of the abdomen was performed both before and after the administration of intravenous contrast. CONTRAST:  78mL MULTIHANCE GADOBENATE DIMEGLUMINE 529 MG/ML IV SOLN COMPARISON:  CT abdomen 05/16/2016 FINDINGS: Lower chest:  Small LEFT Hepatobiliary: The liver has a nodular contour with cirrhosis. There is enlargement of the caudate lobe and  some contraction in the RIGHT hepatic lobe. Geographic lesion central RIGHT hepatic lobe measures 2.59 x 2.2 cm has high signal intensity T2 weighted imaging. This lesion has no post-contrast enhancement is felt to represent benign focus of scar inflammation. Pancreas: Normal pancreatic parenchymal intensity. No ductal dilatation or inflammation. Spleen: Normal spleen. Adrenals/urinary tract: Nodularity the perinephric space. Hydronephrosis on the LEFT secondary to entrapment of the LEFT ureter. Potential stone within the ureter on comparison CT Stomach/Bowel: Stomach and limited of the small bowel is unremarkable Vascular/Lymphatic: Abdominal aorta is normal caliber. There is extensive lymphadenopathy surrounding the aorta from the level of the adrenal glands to the bifurcation. This masslike adenopathy measures 6.35 by 0.4 cm axial dimension on 75, series 1201. There is adjacent involvement of the LEFT psoas muscle which is expanded and enhancing measuring 5.8 x 5.4 cm (image 79, series 120). This compares to the small 2.1 x 4.1 cm normal RIGHT psoas muscle There is enhancing nodularity the LEFT perinephric space inferior to the LEFT kidney. Musculoskeletal: Malignant lesion of the LEFT psoas muscle as described above. No aggressive osseous lesion identified. IMPRESSION: 1. 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 Findings  are concerning for SARCOMA, lymphoma, or urothelial carcinoma. 2. Adjacent bulky periaortic adenopathy near completely surrounding the aorta. Findings are more suggestive of metastatic adenopathy rather than retroperitoneal fibrosis. 3. 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 4. Cirrhotic liver with benign-appearing lesion in RIGHT hepatic. 5. Hydronephrosis on the LEFT secondary to entrapment of the LEFT ureter by the the retroperitoneal process. Potential stone within the ureter additionally. Electronically Signed   By: Suzy Bouchard M.D.   On: 05/18/2016 08:40     Dg Chest Port 1 View  Result Date: 05/19/2016 CLINICAL DATA:  Shortness of breath EXAM: PORTABLE CHEST 1 VIEW COMPARISON:  Multiple exams, including 05/17/2016 FINDINGS: Atherosclerotic aortic arch. Mild blunting of the left lateral costophrenic angle. Heart size within normal limits for projection. The lungs appear otherwise clear. IMPRESSION: 1. Small left pleural effusion causing blunting of the left lateral costophrenic angle. 2. Atherosclerotic aortic arch. Electronically Signed   By: Van Clines M.D.   On: 05/19/2016 10:25   Dg Chest Port 1 View  Result Date: 05/17/2016 CLINICAL DATA:  Shortness of breath EXAM: PORTABLE CHEST 1 VIEW COMPARISON:  11/22/2009 FINDINGS: The heart size appears normal. There is aortic atherosclerosis noted. Chronic asymmetric elevation of right hemidiaphragm noted. No pleural effusion or edema. IMPRESSION: 1. No acute cardiopulmonary abnormalities. 2. Aortic atherosclerosis. Electronically Signed   By: Kerby Moors M.D.   On: 05/17/2016 12:11    Time Spent in minutes  35  Verneita Griffes, MD [facility] [phone]

## 2016-05-22 ENCOUNTER — Inpatient Hospital Stay (HOSPITAL_COMMUNITY): Payer: Medicare Other

## 2016-05-22 ENCOUNTER — Encounter (HOSPITAL_COMMUNITY): Payer: Self-pay | Admitting: Interventional Radiology

## 2016-05-22 DIAGNOSIS — C8330 Diffuse large B-cell lymphoma, unspecified site: Secondary | ICD-10-CM

## 2016-05-22 HISTORY — PX: IR GENERIC HISTORICAL: IMG1180011

## 2016-05-22 LAB — CBC WITH DIFFERENTIAL/PLATELET
BASOS PCT: 0 %
Basophils Absolute: 0 10*3/uL (ref 0.0–0.1)
Eosinophils Absolute: 0.3 10*3/uL (ref 0.0–0.7)
Eosinophils Relative: 5 %
HEMATOCRIT: 33 % — AB (ref 36.0–46.0)
HEMOGLOBIN: 10.6 g/dL — AB (ref 12.0–15.0)
LYMPHS PCT: 6 %
Lymphs Abs: 0.4 10*3/uL — ABNORMAL LOW (ref 0.7–4.0)
MCH: 26.8 pg (ref 26.0–34.0)
MCHC: 32.1 g/dL (ref 30.0–36.0)
MCV: 83.5 fL (ref 78.0–100.0)
MONOS PCT: 11 %
Monocytes Absolute: 0.7 10*3/uL (ref 0.1–1.0)
NEUTROS ABS: 4.7 10*3/uL (ref 1.7–7.7)
NEUTROS PCT: 78 %
Platelets: 201 10*3/uL (ref 150–400)
RBC: 3.95 MIL/uL (ref 3.87–5.11)
RDW: 14.1 % (ref 11.5–15.5)
WBC: 6 10*3/uL (ref 4.0–10.5)

## 2016-05-22 LAB — GLUCOSE, CAPILLARY
GLUCOSE-CAPILLARY: 227 mg/dL — AB (ref 65–99)
GLUCOSE-CAPILLARY: 192 mg/dL — AB (ref 65–99)
Glucose-Capillary: 161 mg/dL — ABNORMAL HIGH (ref 65–99)
Glucose-Capillary: 183 mg/dL — ABNORMAL HIGH (ref 65–99)

## 2016-05-22 LAB — COMPREHENSIVE METABOLIC PANEL
ALBUMIN: 2.8 g/dL — AB (ref 3.5–5.0)
ALK PHOS: 69 U/L (ref 38–126)
ALT: 16 U/L (ref 14–54)
ANION GAP: 10 (ref 5–15)
AST: 28 U/L (ref 15–41)
BILIRUBIN TOTAL: 0.7 mg/dL (ref 0.3–1.2)
BUN: 22 mg/dL — AB (ref 6–20)
CALCIUM: 10.6 mg/dL — AB (ref 8.9–10.3)
CO2: 25 mmol/L (ref 22–32)
Chloride: 100 mmol/L — ABNORMAL LOW (ref 101–111)
Creatinine, Ser: 1.16 mg/dL — ABNORMAL HIGH (ref 0.44–1.00)
GFR calc Af Amer: 52 mL/min — ABNORMAL LOW (ref >60)
GFR calc non Af Amer: 45 mL/min — ABNORMAL LOW (ref >60)
GLUCOSE: 155 mg/dL — AB (ref 65–99)
Potassium: 3.8 mmol/L (ref 3.5–5.1)
Sodium: 135 mmol/L (ref 135–145)
TOTAL PROTEIN: 5.9 g/dL — AB (ref 6.5–8.1)

## 2016-05-22 MED ORDER — FAMCICLOVIR 500 MG PO TABS
500.0000 mg | ORAL_TABLET | Freq: Every day | ORAL | Status: DC
  Administered 2016-05-22 – 2016-05-23 (×2): 500 mg via ORAL
  Filled 2016-05-22 (×2): qty 1

## 2016-05-22 MED ORDER — MIDAZOLAM HCL 2 MG/2ML IJ SOLN
INTRAMUSCULAR | Status: AC
  Filled 2016-05-22: qty 2

## 2016-05-22 MED ORDER — CEFAZOLIN SODIUM-DEXTROSE 2-4 GM/100ML-% IV SOLN
INTRAVENOUS | Status: AC
  Administered 2016-05-22: 2000 mg
  Filled 2016-05-22: qty 100

## 2016-05-22 MED ORDER — MIDAZOLAM HCL 2 MG/2ML IJ SOLN
INTRAMUSCULAR | Status: AC | PRN
  Administered 2016-05-22 (×2): 1 mg via INTRAVENOUS

## 2016-05-22 MED ORDER — HEPARIN SOD (PORK) LOCK FLUSH 100 UNIT/ML IV SOLN
INTRAVENOUS | Status: AC | PRN
  Administered 2016-05-22: 500 [IU] via INTRAVENOUS

## 2016-05-22 MED ORDER — FENTANYL CITRATE (PF) 100 MCG/2ML IJ SOLN
INTRAMUSCULAR | Status: AC | PRN
  Administered 2016-05-22 (×2): 50 ug via INTRAVENOUS

## 2016-05-22 MED ORDER — CEFAZOLIN SODIUM-DEXTROSE 2-4 GM/100ML-% IV SOLN: 2.0000 g | INTRAVENOUS | Status: DC

## 2016-05-22 MED ORDER — LIDOCAINE HCL 1 % IJ SOLN
INTRAMUSCULAR | Status: AC | PRN
  Administered 2016-05-22 (×2): 20 mL

## 2016-05-22 MED ORDER — ALLOPURINOL 100 MG PO TABS
100.0000 mg | ORAL_TABLET | Freq: Every day | ORAL | Status: DC
  Administered 2016-05-22 – 2016-05-23 (×2): 100 mg via ORAL
  Filled 2016-05-22 (×2): qty 1

## 2016-05-22 MED ORDER — HEPARIN SODIUM (PORCINE) 5000 UNIT/ML IJ SOLN
5000.0000 [IU] | Freq: Three times a day (TID) | INTRAMUSCULAR | Status: DC
  Administered 2016-05-23: 5000 [IU] via SUBCUTANEOUS
  Filled 2016-05-22: qty 1

## 2016-05-22 MED ORDER — CALCITONIN (SALMON) 200 UNIT/ML IJ SOLN
400.0000 [IU] | Freq: Once | INTRAMUSCULAR | Status: AC
  Administered 2016-05-22: 400 [IU] via SUBCUTANEOUS
  Filled 2016-05-22: qty 2

## 2016-05-22 MED ORDER — LIDOCAINE HCL 1 % IJ SOLN
INTRAMUSCULAR | Status: AC
  Filled 2016-05-22: qty 20

## 2016-05-22 MED ORDER — FENTANYL CITRATE (PF) 100 MCG/2ML IJ SOLN
INTRAMUSCULAR | Status: AC
  Filled 2016-05-22: qty 2

## 2016-05-22 MED ORDER — HEPARIN SOD (PORK) LOCK FLUSH 100 UNIT/ML IV SOLN
INTRAVENOUS | Status: AC
  Filled 2016-05-22: qty 5

## 2016-05-22 NOTE — Sedation Documentation (Signed)
Patient denies pain and is resting comfortably.  

## 2016-05-22 NOTE — Progress Notes (Signed)
MEDICATION RELATED CONSULT NOTE   IR Procedure Consult - Anticoagulant/Antiplatelet PTA/Inpatient Med List Review by Pharmacist    Procedure:  Placement of a right IJ approach single lumen PowerPort    Completed: 8/31  Post-Procedural bleeding risk per IR MD assessment:  standard  Antithrombotic medications on inpatient or PTA profile prior to procedure:   Pt on subcutaneous heparin 5000 units q 8 hours prior to procedure, was held for today x 2 doses and is scheduled to restart tomorrow morning.  Thank you for allowing [place] to participate in this patients care. Jens Som, PharmD Pager: [phone]

## 2016-05-22 NOTE — Procedures (Signed)
Interventional Radiology Procedure Note  Procedure: Placement of a right IJ approach single lumen PowerPort.  Tip is positioned at the superior cavoatrial junction and catheter is ready for immediate use.  Complications: No immediate Recommendations:  - Ok to shower tomorrow - Do not submerge for 7 days - Routine line care   Signed,  Jasiri Hanawalt S. Nayely Dingus, DO    

## 2016-05-22 NOTE — Care Management Important Message (Signed)
Important Message  Patient Details  Name: Grace Patel MRN: [id number] Date of Birth: October 16, 1940   Medicare Important Message Given:  Yes    Thomson Herbers [address] 05/22/2016, 10:56 AM

## 2016-05-22 NOTE — Progress Notes (Signed)
Grace Patel, surprisingly enough, has large cell non-Hodgkin's lymphoma. As called by the pathologist yesterday. 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  I think that of all the potential diagnoses, this is the best that she could have as this is clearly most treatable.  I think one of the issues is that she will be treated down in [place]. She will be living with her daughter who lives in [place].  Because of the diagnosis, we have to get treatment started pretty quickly. To try to expedite things, I will get an echocardiogram on her. We'll try to get a Port-A-Cath put in while she is up here. I think this would help out tremendously.  I will start her on allopurinol. I will start her on Famvir.  I long talk with her this morning. I explained her the diagnosis. I told her that I was very encouraged by the diagnosis because this is the easiest thing for [place] to treat.  I told about how the chemotherapy works. I told her that chemotherapy probably is effective in over 80% of patients. This is one of those diseases that can certainly be curable.  I did go over side effects with her. I would think that this tender therapy would be R-CHOP. I know we don't have back the cytogenetics on the lymphoma  to see if this is a "double hit" lymphoma. From my point of view, I don't think this makes a difference in how she is treated.  I told her about hair loss. I told her about the risk of infection that we typically give a white cell booster shot after each treatment. I told her that treatment would be 1 day every 3 weeks. I told her that there might be headache. She may have some diarrhea. She may have constipation. I told her that the most likely side effect that she would have would be fatigue.  Again, she will be treated in [place]. We can see about making a referral to the [facility]. I think they could do a very good job in treating her.  I  gave her my card and told her to have her daughter call me so I talked to her daughter about all this.  I think once she gets the echocardiogram and Port-A-Cath placed, then she should be able to be discharged over the Labor Day weekend and be seen early next week in [place].  If she is to be discharged, she MUST have copies of her labs, radiology reports, and path report to help the oncologist in [place].  I spent about 30 minutes with her. I think she has a good understanding of the situation.  Again, I think of all the potential diagnoses, this is the best that she could have.  She actually looks quite good. Her calcium level has been holding steady. I probably would give her a dose of calcitonin just to make sure that her calcium level stays down.  Grace Haw, MD  Grace Patel 6:9-10

## 2016-05-22 NOTE — Sedation Documentation (Signed)
Successful port placement R chest

## 2016-05-22 NOTE — Sedation Documentation (Signed)
Patient is resting comfortably. 

## 2016-05-22 NOTE — Progress Notes (Signed)
PROGRESS NOTE                                                                                                                                                                                                            Chief Complaint  Patient presents with  . Flank Pain       Brief Narrative      75 y.o. ?  type 2 diabetes,  hyperlipidemia,  hypertension  Admitted from ED  due to several weeks of nausea/vomiting, flank pain and ct scan abdomen/pelvis showing left hydronephrosis, multiple adenopathies and liver lesion suspicious for malignancy.   + nausea, emesis, diarrhea fatigue and malaise She denies fever, chills, but complains of night sweats, the past few days.  She denies abdominal pain, melena or hematochezia. She denies GU symptoms. She denies chest pain, palpitations, dyspnea, pitting edema of the lower extremities.   Further workup showed hypercalcemia, possible liver mass, abdominal lymphadenopathy and retroperitoneal mass, left-sided hydronephrosis. IR, oncology and urology were consulted, hypercalcemia has resolved, she underwent her to peritoneal mass biopsy by IR on 05/19/2016 Which has confirmed a B-cell non-Hodgkin's lymphoma and oncology is guiding [place] in her care    Subjective:   Doing okay No pain Some discomfort from sleeping poorly Little bit anxious Otherwise eating and drinking No nausea vomiting No fever Passing the urine No chills   Assessment  & Plan :     1. Severe hypercalcemia induced nausea vomiting, dehydration, acute renal failure and weakness.  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 .  Suppressed PTH of 14,  TSH stable,  In process since 8/27 PTH RP, 1, 25 and 25 hydroxy vitamin D levels was 129 from 8./27 CEA wnl   2. Abdominal lymphadenopathy, possible liver mass, elevated calcium.  Suspicious for lymphoproliferative disorder.   CT chest unremarkable liver MRI showing infiltration of [place] muscle suspicious for sarcoma versus lymphoma along with diffuse lymphadenopathy in the abdomen. I gave a report to daughter and she is disucssion giwht family options-might seek care in [place] [facility]  Oncology is following,  biopsy  of retroperitoneal mass by IR on 05/19/2016  3. Left-sided hydronephrosis, likely due to abdominal adenopathy. Currently renal function is stable, urology dr. Pilar Jarvis consulted 8/26 ? likely outpatient stent placement dependant on labs etc. Not sure how necessary this will be his creatinine has trended downward  4. Hypertension. Continue home hospital blocker. Hold ACE inhibitor and diuretic due to renal failure.  5. ARF due to combination of #1, #2 and #3. Hydrate and monitor, avoid nephrotoxins, renal function improving.   6. Dyslipidemia. Resume home dose statin.   7. Mild Fluid OD - stopped IVF, resolved with IV Lasix on 05/19/2016.  8. DM type II. complicated by hypoglycemia due to patient being on Actos in the presence of renal failure. D5W and monitor, sliding scale as needed. Sugar trends 160-227  Lab Results  Component Value Date   HGBA1C 6.4 (H) 05/17/2016   CBG (last 3)   Recent Labs  05/21/16 2018 05/22/16 0743 05/22/16 1134  GLUCAP 216* 161* 16*      Family Communication  :  Long discussion with family on telephone expect discharge 24-48 hours dependent on preparations for chemotherapy  Code Status :  Full  Diet : Heart Healthy Low Carb  Disposition Plan  :  Stay inpt, spoke with daugther jennifer  Consults  :  Onco, IR, Urology  Procedures  :    CT scan abdomen and  pelvis with abdominal lymphadenopathy, possible liver mass, left-sided obstructive uropathy, possible cirrhosis.  MRI of the liver showing malignant alteration of the psoas muscle suspicious for sarcoma versus lymphoma along with diffuse liver neuropathy and left-sided obstructive uropathy.  Retroperitoneal mass CT-guided biopsy by IR on 05/19/2016   DVT Prophylaxis  :  Heparin    Lab Results  Component Value Date   PLT 201 05/22/2016    Inpatient Medications  Scheduled Meds: . allopurinol  100 mg Oral Daily  . aspirin EC  81 mg Oral Daily  . atenolol  100 mg Oral QPM  . cefTRIAXone (ROCEPHIN)  IV  1 g Intravenous Q24H  . famciclovir  500 mg Oral Daily  . feeding supplement (ENSURE ENLIVE)  237 mL Oral BID BM  . [START ON 05/23/2016] heparin  5,000 Units Subcutaneous Q8H  . insulin aspart  0-5 Units Subcutaneous QHS  . insulin aspart  0-9 Units Subcutaneous TID WC  . rosuvastatin  10 mg Oral Once per day on Mon Wed Fri  . sodium chloride flush  3 mL Intravenous Q12H   Continuous Infusions:   PRN Meds:.LORazepam, ondansetron **OR** ondansetron (ZOFRAN) IV  Antibiotics  :    Anti-infectives    Start     Dose/Rate Route Frequency Ordered Stop   05/22/16 1000  famciclovir [facility]) tablet 500 mg     500 mg Oral Daily 05/22/16 0723     05/17/16 2000  cefTRIAXone (ROCEPHIN) 1 g in dextrose 5 % 50 mL IVPB     1 g 100 mL/hr over 30 Minutes Intravenous Every 24 hours 05/16/16 2158     05/16/16 1615  cefTRIAXone (ROCEPHIN) 1 g in dextrose 5 % 50 mL IVPB     1 g 100 mL/hr over 30 Minutes Intravenous  Once 05/16/16 1614 05/16/16 1950         Objective:   Vitals:   05/21/16 1722 05/21/16 2021 05/22/16 0437 05/22/16 0747  BP: (!) 124/47 (!) 119/59 (!) 133/35 (!) 129/46  Pulse: 80 75 74 81  Resp: 18 19 20 18   Temp: 98.4 F (36.9 C)  98.4 F (36.9 C) 98.6 F (37 C) 98.2 F (36.8 C)  TempSrc: Oral Oral Oral Oral  SpO2: 97% 96% 93% 95%  Weight:      Height:        Wt  Readings from Last 3 Encounters:  05/20/16 83 kg (182 lb 15.7 oz)     Intake/Output Summary (Last 24 hours) at 05/22/16 1346 Last data filed at 05/22/16 1043  Gross per 24 hour  Intake              590 ml  Output                0 ml  Net              590 ml     Physical Exam  Awake Alert, Oriented X 3, No new F.N deficits, Normal affect NCat, obese Supple Neck,No JVD, No LAN Symmetrical Chest wall movement, CTAB RRR,No Gallops,Rubs or new Murmurs, No Parasternal Heave +ve B.Sounds, Abd Soft, No tenderness, No organomegaly appriciated, No rebound - guarding or rigidity.    Data Review:    CBC  Recent Labs Lab 05/18/16 0520 05/19/16 0521 05/20/16 0802 05/21/16 0416 05/22/16 0345  WBC 5.4 5.0 6.5 5.6 6.0  HGB 10.7* 10.7* 11.1* 10.8* 10.6*  HCT 33.2* 33.9* 34.5* 33.3* 33.0*  PLT 218 201 225 164 201  MCV 85.3 85.6 84.6 82.8 83.5  MCH 27.5 27.0 27.2 26.9 26.8  MCHC 32.2 31.6 32.2 32.4 32.1  RDW 14.3 14.4 14.2 14.1 14.1  LYMPHSABS 0.6* 0.6* 0.5* 0.6* 0.4*  MONOABS 0.6 0.6 0.7 0.6 0.7  EOSABS 0.2 0.2 0.3 0.2 0.3  BASOSABS 0.0 0.0 0.0 0.0 0.0    Chemistries   Recent Labs Lab 05/18/16 0520 05/19/16 0521 05/20/16 0802 05/21/16 0416 05/22/16 0345  NA 139 139 137 136 135  K 3.9 4.0 4.0 3.9 3.8  CL 109 106 103 100* 100*  CO2 23 24 24 22 25   GLUCOSE 144* 127* 135* 145* 155*  BUN 29* 19 18 21* 22*  CREATININE 1.23* 1.25* 1.09* 1.14* 1.16*  CALCIUM 11.3* 10.1 10.2 10.4* 10.6*  AST 30 29 32 31 28  ALT 15 15 17 16 16   ALKPHOS 65 63 67 64 69  BILITOT 0.6 0.4 0.7 0.7 0.7   ------------------------------------------------------------------------------------------------------------------ No results for input(s): CHOL, HDL, LDLCALC, TRIG, CHOLHDL, LDLDIRECT in the last 72 hours.  Lab Results  Component Value Date   HGBA1C 6.4 (H) 05/17/2016   ------------------------------------------------------------------------------------------------------------------ No  results for input(s): TSH, T4TOTAL, T3FREE, THYROIDAB in the last 72 hours.  Invalid input(s): FREET3 ------------------------------------------------------------------------------------------------------------------ No results for input(s): VITAMINB12, FOLATE, FERRITIN, TIBC, IRON, RETICCTPCT in the last 72 hours.  Coagulation profile  Recent Labs Lab 05/17/16 1352  INR 1.30    No results for input(s): DDIMER in the last 72 hours.  Cardiac Enzymes No results for input(s): CKMB, TROPONINI, MYOGLOBIN in the last 168 hours.  Invalid input(s): CK ------------------------------------------------------------------------------------------------------------------ No results found for: BNP  Micro Results Recent Results (from the past 240 hour(s))  Blood culture (routine x 2)     Status: None   Collection Time: 05/16/16  5:20 PM  Result Value Ref Range Status   Specimen Description BLOOD RIGHT HAND  Final   Special Requests BOTTLES DRAWN AEROBIC AND ANAEROBIC 5CC EA  Final   Culture NO GROWTH 5 DAYS  Final   Report Status 05/21/2016 FINAL  Final  Blood culture (routine x 2)     Status: None  Collection Time: 05/16/16  5:40 PM  Result Value Ref Range Status   Specimen Description BLOOD LEFT HAND  Final   Special Requests IN PEDIATRIC BOTTLE [facility]  Final   Culture NO GROWTH 5 DAYS  Final   Report Status 05/21/2016 FINAL  Final  Urine culture     Status: None   Collection Time: 05/16/16  7:04 PM  Result Value Ref Range Status   Specimen Description URINE, RANDOM  Final   Special Requests NONE  Final   Culture NO GROWTH  Final   Report Status 05/18/2016 FINAL  Final    Radiology Reports Ct Chest Wo Contrast  Result Date: 05/17/2016 CLINICAL DATA:  Evaluate adenopathy.  Chronic fatigue. EXAM: CT CHEST WITHOUT CONTRAST TECHNIQUE: Multidetector CT imaging of the chest was performed following the standard protocol without IV contrast. COMPARISON:  None. FINDINGS: Cardiovascular:  Normal heart size. No pericardial effusion. Aortic atherosclerosis noted. Calcification within the RCA, LAD and left circumflex coronary artery noted. Mediastinum/Nodes: The trachea appears patent and is midline. Normal appearance of the esophagus. No mediastinal or hilar adenopathy. There is no axillary or supraclavicular adenopathy. Lungs/Pleura: Small left pleural effusion is identified. There is no airspace consolidation or atelectasis. No suspicious pulmonary nodule or mass identified. Upper Abdomen: Morphologic features a liver compatible cirrhosis identified. Again noted is a focal area of low attenuation within the right lobe of liver measuring 2.9 cm, image 123 of series 2. Upper abdominal adenopathy is again noted as described on CT from 05/16/2016. See report from that date for more detailed description of changes in the upper abdomen. Musculoskeletal: No aggressive lytic or sclerotic bone lesions identified within the upper abdomen. IMPRESSION: 1. No evidence for thoracic adenopathy. 2. Aortic atherosclerosis and multi vessel coronary artery calcification. 3. Small left pleural effusion. 4. See report from CT abdomen pelvis dated 05/16/2016 regarding details of the upper abdomen. Electronically Signed   By: Kerby Moors M.D.   On: 05/17/2016 18:10   Ct Abdomen W Contrast  Result Date: 05/16/2016 CLINICAL DATA:  Nausea and vomiting for several weeks. EXAM: CT ABDOMEN WITH CONTRAST TECHNIQUE: Multidetector CT imaging of the abdomen was performed using the standard protocol following bolus administration of intravenous contrast. CONTRAST:  17mL ISOVUE-300 IOPAMIDOL (ISOVUE-300) INJECTION 61% COMPARISON:  None. FINDINGS: Lower chest: There is a small left pleural effusion identified. The lung bases appear clear. Hepatobiliary: The liver appears cirrhotic. The contour the liver is diffusely nodular and there is hypertrophy of the caudate lobe lateral segment of left lobe. Indeterminate structure in the  right lobe of liver measures 3.5 x 1.6 cm and 46 HU. The gallbladder appears normal. No biliary dilatation. Pancreas: No inflammation or mass identified. Spleen: The spleen appears normal. The spleen measures 10 cm in length. Adrenals/Urinary Tract: Normal appearance of the adrenal glands. The kidneys half an irregular contour bilaterally suggestive of chronic scarring. There is left-sided hydronephrosis. Stomach/Bowel: The stomach appears normal. The small bowel loops have a normal course and caliber. No pathologic dilatation of the colon. Vascular/Lymphatic: Aortic atherosclerosis is noted. No aneurysm. Extensive retroperitoneal adenopathy is identified. Nodal mass within the retroperitoneum measures 9.3 x 5.0 cm, image 35 of series 3. There are numerous soft tissue nodules identified within the left-sided perinephric space. Other: Mild soft tissue nodularity within the perineum is identified along with ascites. In the setting of cirrhosis the ascites is nonspecific. Musculoskeletal: No aggressive lytic or sclerotic bone lesions. IMPRESSION: 1. Extensive adenopathy is identified within the upper abdomen which is worrisome  for either a metastatic adenopathy versus lymphoproliferative disorder such as lymphoma. Further assessment with PET-CT and tissue sampling is recommended. 2. There is evidence of soft tissue nodularity within the peritoneum as well as within the left perinephric fat. 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 Ascites 4. Left-sided obstructive uropathy. Favor obstruction of the left ureter secondary to retroperitoneal adenopathy. 5. Morphologic features of the liver compatible with cirrhosis. 6. Indeterminate intermediate attenuating structure within right lobe of liver. A more definitive assessment of this structure could be obtained with a contrast enhanced MR of the liver. 7. Aortic atherosclerosis.  No aneurysm. Electronically Signed   By: Kerby Moors M.D.   On: 05/16/2016  13:23   Ct Guided Needle Placement  Result Date: 05/19/2016 CLINICAL DATA:  Left-sided retroperitoneal mass involving the psoas muscle with associated para-aortic tumor extension versus lymphadenopathy. EXAM: CT GUIDED CORE BIOPSY OF RETROPERITONEAL ABDOMINAL MASS ANESTHESIA/SEDATION: 1.0  Mg IV Versed; 50 mcg IV Fentanyl Total Moderate Sedation Time: 15 minutes. PROCEDURE: The procedure risks, benefits, and alternatives were explained to the patient. Questions regarding the procedure were encouraged and answered. The patient understands and consents to the procedure. The left translumbar region was prepped with chlorhexidine in a sterile fashion, and a sterile drape was applied covering the operative field. A sterile gown and sterile gloves were used for the procedure. Local anesthesia was provided with 1% Lidocaine. CT was performed through the abdomen in a prone position. Under CT guidance, a 17 gauge needle was advanced to the level of the left psoas muscle as well as adjacent retroperitoneal lymphadenopathy. A total of 4 separate 18 gauge core biopsy samples were obtained and submitted in both formalin and saline. After the procedure the outer needle was removed and additional CT images performed. COMPLICATIONS: None FINDINGS: Again noted is mass enlargement of the left psoas muscle with adjacent retroperitoneal lymphadenopathy. Core biopsy samples were made of the infiltrated psoas muscle itself as well as lymph nodes just beyond the anterior margin of the muscle. Solid tissue was obtained. IMPRESSION: CT-guided core biopsy performed of left retroperitoneal mass involving the psoas muscle and adjacent retroperitoneal lymphadenopathy. Electronically Signed   By: Aletta Edouard M.D.   On: 05/19/2016 14:45   Mr Liver W Wo Contrast  Result Date: 05/18/2016 CLINICAL DATA:  75 year old female with nausea, diarrhea more days. Abdominal adenopathy. Indeterminate renal lesion EXAM: MRI ABDOMEN WITHOUT AND WITH  CONTRAST TECHNIQUE: Multiplanar multisequence MR imaging of the abdomen was performed both before and after the administration of intravenous contrast. CONTRAST:  99mL MULTIHANCE GADOBENATE DIMEGLUMINE 529 MG/ML IV SOLN COMPARISON:  CT abdomen 05/16/2016 FINDINGS: Lower chest:  Small LEFT Hepatobiliary: The liver has a nodular contour with cirrhosis. There is enlargement of the caudate lobe and some contraction in the RIGHT hepatic lobe. Geographic lesion central RIGHT hepatic lobe measures 2.59 x 2.2 cm has high signal intensity T2 weighted imaging. This lesion has no post-contrast enhancement is felt to represent benign focus of scar inflammation. Pancreas: Normal pancreatic parenchymal intensity. No ductal dilatation or inflammation. Spleen: Normal spleen. Adrenals/urinary tract: Nodularity the perinephric space. Hydronephrosis on the LEFT secondary to entrapment of the LEFT ureter. Potential stone within the ureter on comparison CT Stomach/Bowel: Stomach and limited of the small bowel is unremarkable Vascular/Lymphatic: Abdominal aorta is normal caliber. There is extensive lymphadenopathy surrounding the aorta from the level of the adrenal glands to the bifurcation. This masslike adenopathy measures 6.35 by 0.4 cm axial dimension on 75, series 1201. There is  adjacent involvement of the LEFT psoas muscle which is expanded and enhancing measuring 5.8 x 5.4 cm (image 79, series 120). This compares to the small 2.1 x 4.1 cm normal RIGHT psoas muscle There is enhancing nodularity the LEFT perinephric space inferior to the LEFT kidney. Musculoskeletal: Malignant lesion of the LEFT psoas muscle as described above. No aggressive osseous lesion identified. 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 2. Adjacent bulky periaortic adenopathy near completely surrounding the aorta. Findings are more suggestive of metastatic adenopathy  rather than retroperitoneal fibrosis. 3. 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 4. Cirrhotic liver with benign-appearing lesion in RIGHT hepatic. 5. Hydronephrosis on the LEFT secondary to entrapment of the LEFT ureter by the the retroperitoneal process. Potential stone within the ureter additionally. Electronically Signed   By: Suzy Bouchard M.D.   On: 05/18/2016 08:40   Dg Chest Port 1 View  Result Date: 05/19/2016 CLINICAL DATA:  Shortness of breath EXAM: PORTABLE CHEST 1 VIEW COMPARISON:  Multiple exams, including 05/17/2016 FINDINGS: Atherosclerotic aortic arch. Mild blunting of the left lateral costophrenic angle. Heart size within normal limits for projection. The lungs appear otherwise clear. IMPRESSION: 1. Small left pleural effusion causing blunting of the left lateral costophrenic angle. 2. Atherosclerotic aortic arch. Electronically Signed   By: Van Clines M.D.   On: 05/19/2016 10:25   Dg Chest Port 1 View  Result Date: 05/17/2016 CLINICAL DATA:  Shortness of breath EXAM: PORTABLE CHEST 1 VIEW COMPARISON:  11/22/2009 FINDINGS: The heart size appears normal. There is aortic atherosclerosis noted. Chronic asymmetric elevation of right hemidiaphragm noted. No pleural effusion or edema. IMPRESSION: 1. No acute cardiopulmonary abnormalities. 2. Aortic atherosclerosis. Electronically Signed   By: Kerby Moors M.D.   On: 05/17/2016 12:11    Time Spent in minutes  35  Verneita Griffes, MD [facility] [phone]

## 2016-05-22 NOTE — Consult Note (Signed)
Chief Complaint: Patient was seen in consultation today for [facility] a cath placement Chief Complaint  Patient presents with  . Flank Pain   at the request of Dr Burney Gauze  Referring Physician(s): Dr Nada Libman  Supervising Physician: Corrie Mckusick  Patient Status: Inpatient  History of Present Illness: Grace Patel is a 75 y.o. 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  Now to start treatment asap per Dr Marin Olp Request for [facility] placement   Past Medical History:  Diagnosis Date  . Diabetes mellitus without complication ([place])   . Hypercholesteremia   . Hypertension     Past Surgical History:  Procedure Laterality Date  . ABDOMINAL HYSTERECTOMY      Allergies: Codeine  Medications: Prior to Admission medications   Medication Sig Start Date End Date Taking? Authorizing Provider  aspirin EC 81 MG tablet Take 81 mg by mouth daily.   Yes Historical Provider, MD  atenolol (TENORMIN) 100 MG tablet Take 100 mg by mouth every evening.   Yes Historical Provider, MD  glimepiride (AMARYL) 4 MG tablet Take 4 mg by mouth daily with breakfast.   Yes Historical Provider, MD  metFORMIN (GLUCOPHAGE) 500 MG tablet Take 1,000 mg by mouth 2 (two) times daily with a meal.   Yes Historical Provider, MD  pioglitazone (ACTOS) 30 MG tablet Take 30 mg by mouth daily.   Yes Historical Provider, MD  rosuvastatin (CRESTOR) 20 MG tablet Take 10 mg by mouth See admin instructions. Take on Mondays Wednesday and Friday mornings   Yes Historical Provider, MD  valsartan-hydrochlorothiazide (DIOVAN-HCT) 320-25 MG tablet Take 1 tablet by mouth daily.   Yes Historical Provider, MD     Family History  Problem Relation Age of Onset  . Heart attack Father   . Heart disease Sister   . Lung disease Brother     Asbestosis  . Heart attack Brother     Social  History   Social History  . Marital status: Widowed    Spouse name: N/A  . Number of children: N/A  . Years of education: N/A   Social History Main Topics  . Smoking status: Never Smoker  . Smokeless tobacco: Never Used  . Alcohol use No  . Drug use: No  . Sexual activity: No   Other Topics Concern  . None   Social History Narrative  . None     Review of Systems: A 12 point ROS discussed and pertinent positives are indicated in the HPI above.  All other systems are negative.  Review of Systems  Constitutional: Positive for activity change, appetite change and fatigue. Negative for diaphoresis and fever.  Respiratory: Negative for shortness of breath.   Cardiovascular: Negative for chest pain.  Gastrointestinal: Positive for abdominal pain.  Neurological: Positive for weakness.  Psychiatric/Behavioral: Negative for behavioral problems and confusion.    Vital Signs: BP (!) 129/46 (BP Location: Right Arm)   Pulse 81   Temp 98.2 F (36.8 C) (Oral)   Resp 18   Ht 4' 11.5" (1.511 m)   Wt 182 lb 15.7 oz (83 kg)   SpO2 95%   BMI 36.34 kg/m   Physical Exam  Constitutional: She is oriented to person, place, and time.  Cardiovascular: Normal rate, regular rhythm and normal heart sounds.   Pulmonary/Chest: Effort normal and breath sounds normal.  Abdominal: Soft. Bowel sounds are normal. There  is no tenderness.  Musculoskeletal: Normal range of motion.  Neurological: She is alert and oriented to person, place, and time.  Skin: Skin is warm and dry.  Psychiatric: She has a normal mood and affect. Her behavior is normal. Judgment and thought content normal.  Nursing note and vitals reviewed.   Mallampati Score:  MD Evaluation Airway: WNL Heart: WNL Abdomen: WNL Chest/ Lungs: WNL ASA  Classification: 3 Mallampati/Airway Score: One  Imaging: Ct Chest Wo Contrast  Result Date: 05/17/2016 CLINICAL DATA:  Evaluate adenopathy.  Chronic fatigue. EXAM: CT CHEST  WITHOUT CONTRAST TECHNIQUE: Multidetector CT imaging of the chest was performed following the standard protocol without IV contrast. COMPARISON:  None. FINDINGS: Cardiovascular: Normal heart size. No pericardial effusion. Aortic atherosclerosis noted. Calcification within the RCA, LAD and left circumflex coronary artery noted. Mediastinum/Nodes: The trachea appears patent and is midline. Normal appearance of the esophagus. No mediastinal or hilar adenopathy. There is no axillary or supraclavicular adenopathy. Lungs/Pleura: Small left pleural effusion is identified. There is no airspace consolidation or atelectasis. No suspicious pulmonary nodule or mass identified. Upper Abdomen: Morphologic features a liver compatible cirrhosis identified. Again noted is a focal area of low attenuation within the right lobe of liver measuring 2.9 cm, image 123 of series 2. Upper abdominal adenopathy is again noted as described on CT from 05/16/2016. See report from that date for more detailed description of changes in the upper abdomen. Musculoskeletal: No aggressive lytic or sclerotic bone lesions identified within the upper abdomen. IMPRESSION: 1. No evidence for thoracic adenopathy. 2. Aortic atherosclerosis and multi vessel coronary artery calcification. 3. Small left pleural effusion. 4. See report from CT abdomen pelvis dated 05/16/2016 regarding details of the upper abdomen. Electronically Signed   By: Kerby Moors M.D.   On: 05/17/2016 18:10   Ct Abdomen W Contrast  Result Date: 05/16/2016 CLINICAL DATA:  Nausea and vomiting for several weeks. EXAM: CT ABDOMEN WITH CONTRAST TECHNIQUE: Multidetector CT imaging of the abdomen was performed using the standard protocol following bolus administration of intravenous contrast. CONTRAST:  157mL ISOVUE-300 IOPAMIDOL (ISOVUE-300) INJECTION 61% COMPARISON:  None. FINDINGS: Lower chest: There is a small left pleural effusion identified. The lung bases appear clear. Hepatobiliary:  The liver appears cirrhotic. The contour the liver is diffusely nodular and there is hypertrophy of the caudate lobe lateral segment of left lobe. Indeterminate structure in the right lobe of liver measures 3.5 x 1.6 cm and 46 HU. The gallbladder appears normal. No biliary dilatation. Pancreas: No inflammation or mass identified. Spleen: The spleen appears normal. The spleen measures 10 cm in length. Adrenals/Urinary Tract: Normal appearance of the adrenal glands. The kidneys half an irregular contour bilaterally suggestive of chronic scarring. There is left-sided hydronephrosis. Stomach/Bowel: The stomach appears normal. The small bowel loops have a normal course and caliber. No pathologic dilatation of the colon. Vascular/Lymphatic: Aortic atherosclerosis is noted. No aneurysm. Extensive retroperitoneal adenopathy is identified. Nodal mass within the retroperitoneum measures 9.3 x 5.0 cm, image 35 of series 3. There are numerous soft tissue nodules identified within the left-sided perinephric space. Other: Mild soft tissue nodularity within the perineum is identified along with ascites. In the setting of cirrhosis the ascites is nonspecific. Musculoskeletal: No aggressive lytic or sclerotic bone lesions. IMPRESSION: 1. Extensive adenopathy is identified within the upper abdomen which is worrisome for either a metastatic adenopathy versus lymphoproliferative disorder such as lymphoma. Further assessment with PET-CT and tissue sampling is recommended. 2. There is evidence of soft tissue nodularity within  the peritoneum as well as within the left perinephric fat. 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 Ascites 4. Left-sided obstructive uropathy. Favor obstruction of the left ureter secondary to retroperitoneal adenopathy. 5. Morphologic features of the liver compatible with cirrhosis. 6. Indeterminate intermediate attenuating structure within right lobe of liver. A more definitive assessment of this  structure could be obtained with a contrast enhanced MR of the liver. 7. Aortic atherosclerosis.  No aneurysm. Electronically Signed   By: Kerby Moors M.D.   On: 05/16/2016 13:23   Ct Guided Needle Placement  Result Date: 05/19/2016 CLINICAL DATA:  Left-sided retroperitoneal mass involving the psoas muscle with associated para-aortic tumor extension versus lymphadenopathy. EXAM: CT GUIDED CORE BIOPSY OF RETROPERITONEAL ABDOMINAL MASS ANESTHESIA/SEDATION: 1.0  Mg IV Versed; 50 mcg IV Fentanyl Total Moderate Sedation Time: 15 minutes. PROCEDURE: The procedure risks, benefits, and alternatives were explained to the patient. Questions regarding the procedure were encouraged and answered. The patient understands and consents to the procedure. The left translumbar region was prepped with chlorhexidine in a sterile fashion, and a sterile drape was applied covering the operative field. A sterile gown and sterile gloves were used for the procedure. Local anesthesia was provided with 1% Lidocaine. CT was performed through the abdomen in a prone position. Under CT guidance, a 17 gauge needle was advanced to the level of the left psoas muscle as well as adjacent retroperitoneal lymphadenopathy. A total of 4 separate 18 gauge core biopsy samples were obtained and submitted in both formalin and saline. After the procedure the outer needle was removed and additional CT images performed. COMPLICATIONS: None FINDINGS: Again noted is mass enlargement of the left psoas muscle with adjacent retroperitoneal lymphadenopathy. Core biopsy samples were made of the infiltrated psoas muscle itself as well as lymph nodes just beyond the anterior margin of the muscle. Solid tissue was obtained. IMPRESSION: CT-guided core biopsy performed of left retroperitoneal mass involving the psoas muscle and adjacent retroperitoneal lymphadenopathy. Electronically Signed   By: Aletta Edouard M.D.   On: 05/19/2016 14:45   Mr Liver W Wo  Contrast  Result Date: 05/18/2016 CLINICAL DATA:  75 year old female with nausea, diarrhea more days. Abdominal adenopathy. Indeterminate renal lesion EXAM: MRI ABDOMEN WITHOUT AND WITH CONTRAST TECHNIQUE: Multiplanar multisequence MR imaging of the abdomen was performed both before and after the administration of intravenous contrast. CONTRAST:  70mL MULTIHANCE GADOBENATE DIMEGLUMINE 529 MG/ML IV SOLN COMPARISON:  CT abdomen 05/16/2016 FINDINGS: Lower chest:  Small LEFT Hepatobiliary: The liver has a nodular contour with cirrhosis. There is enlargement of the caudate lobe and some contraction in the RIGHT hepatic lobe. Geographic lesion central RIGHT hepatic lobe measures 2.59 x 2.2 cm has high signal intensity T2 weighted imaging. This lesion has no post-contrast enhancement is felt to represent benign focus of scar inflammation. Pancreas: Normal pancreatic parenchymal intensity. No ductal dilatation or inflammation. Spleen: Normal spleen. Adrenals/urinary tract: Nodularity the perinephric space. Hydronephrosis on the LEFT secondary to entrapment of the LEFT ureter. Potential stone within the ureter on comparison CT Stomach/Bowel: Stomach and limited of the small bowel is unremarkable Vascular/Lymphatic: Abdominal aorta is normal caliber. There is extensive lymphadenopathy surrounding the aorta from the level of the adrenal glands to the bifurcation. This masslike adenopathy measures 6.35 by 0.4 cm axial dimension on 75, series 1201. There is adjacent involvement of the LEFT psoas muscle which is expanded and enhancing measuring 5.8 x 5.4 cm (image 79, series 120). This compares to the small 2.1 x 4.1  cm normal RIGHT psoas muscle There is enhancing nodularity the LEFT perinephric space inferior to the LEFT kidney. Musculoskeletal: Malignant lesion of the LEFT psoas muscle as described above. No aggressive osseous lesion identified. 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  concerning for SARCOMA, lymphoma, or urothelial carcinoma. 2. Adjacent bulky periaortic adenopathy near completely surrounding the aorta. Findings are more suggestive of metastatic adenopathy rather than retroperitoneal fibrosis. 3. 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 4. Cirrhotic liver with benign-appearing lesion in RIGHT hepatic. 5. Hydronephrosis on the LEFT secondary to entrapment of the LEFT ureter by the the retroperitoneal process. Potential stone within the ureter additionally. Electronically Signed   By: Suzy Bouchard M.D.   On: 05/18/2016 08:40   Dg Chest Port 1 View  Result Date: 05/19/2016 CLINICAL DATA:  Shortness of breath EXAM: PORTABLE CHEST 1 VIEW COMPARISON:  Multiple exams, including 05/17/2016 FINDINGS: Atherosclerotic aortic arch. Mild blunting of the left lateral costophrenic angle. Heart size within normal limits for projection. The lungs appear otherwise clear. IMPRESSION: 1. Small left pleural effusion causing blunting of the left lateral costophrenic angle. 2. Atherosclerotic aortic arch. Electronically Signed   By: Van Clines M.D.   On: 05/19/2016 10:25   Dg Chest Port 1 View  Result Date: 05/17/2016 CLINICAL DATA:  Shortness of breath EXAM: PORTABLE CHEST 1 VIEW COMPARISON:  11/22/2009 FINDINGS: The heart size appears normal. There is aortic atherosclerosis noted. Chronic asymmetric elevation of right hemidiaphragm noted. No pleural effusion or edema. IMPRESSION: 1. No acute cardiopulmonary abnormalities. 2. Aortic atherosclerosis. Electronically Signed   By: Kerby Moors M.D.   On: 05/17/2016 12:11    Labs:  CBC:  Recent Labs  05/19/16 0521 05/20/16 0802 05/21/16 0416 05/22/16 0345  WBC 5.0 6.5 5.6 6.0  HGB 10.7* 11.1* 10.8* 10.6*  HCT 33.9* 34.5* 33.3* 33.0*  PLT 201 225 164 201    COAGS:  Recent Labs  05/17/16 1352  INR 1.30    BMP:  Recent Labs  05/19/16 0521 05/20/16 0802 05/21/16 0416 05/22/16 0345  NA  139 137 136 135  K 4.0 4.0 3.9 3.8  CL 106 103 100* 100*  CO2 24 24 22 25   GLUCOSE 127* 135* 145* 155*  BUN 19 18 21* 22*  CALCIUM 10.1 10.2 10.4* 10.6*  CREATININE 1.25* 1.09* 1.14* 1.16*  GFRNONAA 41* 48* 46* 45*  GFRAA 48* 56* 53* 52*    LIVER FUNCTION TESTS:  Recent Labs  05/19/16 0521 05/20/16 0802 05/21/16 0416 05/22/16 0345  BILITOT 0.4 0.7 0.7 0.7  AST 29 32 31 28  ALT 15 17 16 16   ALKPHOS 63 67 64 69  PROT 6.0* 6.4* 5.8* 5.9*  ALBUMIN 2.9* 3.0* 2.8* 2.8*    TUMOR MARKERS:  Recent Labs  05/17/16 1352  CEA REFERT    Assessment and Plan:  New dx Lymphoma Now for treatment as soon as possible Scheduled for Port a Cath placement Risks and Benefits discussed with the patient including, but not limited to bleeding, infection, pneumothorax, or fibrin sheath development and need for additional procedures. All of the patient's questions were answered, patient is agreeable to proceed. Consent signed and in chart.  Thank you for this interesting consult.  I greatly enjoyed meeting Grace Patel and look forward to participating in their care.  A copy of this report was sent to the requesting provider on this date.  Electronically Signed: Monia Sabal A 05/22/2016, 9:51 AM   I spent a  total of 20 Minutes    in face to face in clinical consultation, greater than 50% of which was counseling/coordinating care for [facility] placement

## 2016-05-23 ENCOUNTER — Inpatient Hospital Stay (HOSPITAL_COMMUNITY): Payer: Medicare Other

## 2016-05-23 DIAGNOSIS — Z5111 Encounter for antineoplastic chemotherapy: Secondary | ICD-10-CM

## 2016-05-23 DIAGNOSIS — Z0189 Encounter for other specified special examinations: Secondary | ICD-10-CM

## 2016-05-23 LAB — ECHOCARDIOGRAM COMPLETE
Height: 59.5 in
Weight: 2927.71 oz

## 2016-05-23 LAB — CBC WITH DIFFERENTIAL/PLATELET
BASOS PCT: 1 %
Basophils Absolute: 0 10*3/uL (ref 0.0–0.1)
EOS ABS: 0.3 10*3/uL (ref 0.0–0.7)
EOS PCT: 4 %
HCT: 33.8 % — ABNORMAL LOW (ref 36.0–46.0)
HEMOGLOBIN: 10.7 g/dL — AB (ref 12.0–15.0)
Lymphocytes Relative: 8 %
Lymphs Abs: 0.5 10*3/uL — ABNORMAL LOW (ref 0.7–4.0)
MCH: 26.6 pg (ref 26.0–34.0)
MCHC: 31.7 g/dL (ref 30.0–36.0)
MCV: 84.1 fL (ref 78.0–100.0)
Monocytes Absolute: 0.7 10*3/uL (ref 0.1–1.0)
Monocytes Relative: 11 %
NEUTROS PCT: 76 %
Neutro Abs: 5.1 10*3/uL (ref 1.7–7.7)
PLATELETS: 224 10*3/uL (ref 150–400)
RBC: 4.02 MIL/uL (ref 3.87–5.11)
RDW: 14.1 % (ref 11.5–15.5)
WBC: 6.6 10*3/uL (ref 4.0–10.5)

## 2016-05-23 LAB — GLUCOSE, CAPILLARY
GLUCOSE-CAPILLARY: 156 mg/dL — AB (ref 65–99)
Glucose-Capillary: 175 mg/dL — ABNORMAL HIGH (ref 65–99)

## 2016-05-23 MED ORDER — FAMCICLOVIR 500 MG PO TABS: 500.0000 mg | ORAL_TABLET | Freq: Every day | ORAL | 0 refills | Status: AC

## 2016-05-23 MED ORDER — ONDANSETRON 4 MG PO TBDP: 4.0000 mg | ORAL_TABLET | Freq: Three times a day (TID) | ORAL | 0 refills | Status: AC | PRN

## 2016-05-23 MED ORDER — ALLOPURINOL 100 MG PO TABS: 100.0000 mg | ORAL_TABLET | Freq: Every day | ORAL | 0 refills | Status: AC

## 2016-05-23 NOTE — Progress Notes (Signed)
Patient discharged to home with daughter. All prescriptions reviewed. Appts reviewed and discharge instructions with understanding. IV removed. Telemetry removed. All belongings with the patient. Patient left the floor in stable condtion in wheelchair.   Sheliah Plane RN

## 2016-05-23 NOTE — Discharge Summary (Signed)
Physician Discharge Summary  Grace Patel [id number] DOB: 06-Jun-1941 DOA: 05/16/2016  PCP: No primary care provider on file.  Admit date: 05/16/2016 Discharge date: 05/23/2016  Time spent: 45 minutes  Recommendations for Outpatient Follow-up:  1. Patient will need further subspecialty oncology management as an outpatient and is deciding between follow up in [place] versus in [place] 2. Copies of the patient's diagnostic testing have been provided to patient 3. Will need CBC plus stiff and basic metabolic panel in about one week from the time of discharge--patient will need repeat calcium levels as an outpatient as well given her presentation her hypercalcemia may benefit from pamidronate versus zoledronic acid as an outpatient as per guidance by oncology at either event 4. Has been started based on Dr. Antonieta Pert [oncology]  input on allopurinol and Famvir and the patient was given 1 month supply of both of these medications and prescription written for this 5. PTH-RP to be followed as OP as was pending--although diagnosis won't likely be affected by this 6. Patient is to monitor urine output as an outpatient and may need interval follow-up with urologist if there is a declining urine output  Discharge Diagnoses:  Principal Problem:   Hypercalcemia Active Problems:   Hyperlipidemia   Acute unilateral obstructive uropathy   Hypertension   Liver mass, right lobe   Hydronephrosis of left kidney   UTI (urinary tract infection)   Hyponatremia   Hypoglycemia   Type 2 diabetes mellitus ([place])   Hypothermia   Discharge Condition: Improved  Diet recommendation: Heart healthy low-salt  Filed Weights   05/16/16 2124 05/17/16 2033 05/20/16 2052  Weight: 79.5 kg (175 lb 4.3 oz) 79.5 kg (175 lb 4.3 oz) 83 kg (182 lb 15.7 oz)    History of present illness:  74 y.o.?  type 2 diabetes,  hyperlipidemia,  hypertension  Admitted from ED  due to several weeks of nausea/vomiting, flank  pain and ct scan abdomen/pelvis showing left hydronephrosis, multiple adenopathies and liver lesion suspicious for malignancy.   + nausea, emesis, diarrhea fatigue and malaise She denies fever, chills, but complains of night sweats, the past few days.  She denies abdominal pain, melena or hematochezia. She denies GU symptoms. She denies chest pain, palpitations, dyspnea, pitting edema of the lower extremities.   Further workup showed hypercalcemia, possible liver mass, abdominal lymphadenopathy and retroperitoneal mass, left-sided hydronephrosis. 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  Patient was given another dose of calcitonin on 05/22/16.  Suppressed PTH of 14,  TSH stable,  In process since 8/27 PTH RP, 1, 25 and 25 hydroxy vitamin D levels was 129 from 8./27 CEA wnl   2. Abdominal lymphadenopathy, possible liver mass, elevated calcium.   CT chest unremarkable liver MRI showing infiltration of [place] muscle = lymphoma along with diffuse lymphadenopathy in the abdomen.  Reports and details/records provided to patient-might seek care in [place] at [facility] biopsy of retroperitoneal  mass by IR on 05/19/2016  3. Left-sided hydronephrosis, likely due to abdominal adenopathy. Currently renal function is stable, urology dr. Pilar Jarvis consulted 8/26 ? likely outpatient stent placement dependant on labs etc. Not sure how necessary this will be his creatinine has trended downward  4. Hypertension. Continue home hospital blocker. Hold ACE inhibitor and diuretic due to renal failure.  These medications were resumed as an outpatient and patient needs interval follow-up labs going forward  5. ARF due to combination of #1, #2 and #3. Hydrate and monitor, avoid nephrotoxins, renal function improving.   6. Dyslipidemia. Resume home dose statin.   7. Mild Fluid OD - stopped IVF, resolved with IV Lasix on 05/19/2016  8. DM type II. complicated by hypoglycemia due to patient being on Actos in the presence of renal failure. D5W and monitor, sliding scale as needed. Patient was resumed on home dose of glipizide 4 mg and metformin 1000 mg twice a day on day of discharge  Procedures:  Right IJ single-lumen power port placed 05/22/2016  Echocardiogram performed 05/23/2016 - Left ventricle: The cavity size was normal. Wall thickness was   increased in a pattern of moderate LVH. Systolic function was   normal. The estimated ejection fraction was in the range of 60%   to 65%. Wall motion was normal; there were no regional wall   motion abnormalities. Doppler parameters are consistent with both   elevated ventricular end-diastolic filling pressure and elevated   left atrial filling pressure. - Left atrium: The atrium was mildly dilated. - Atrial septum: No defect or patent foramen ovale was identified. - Pulmonary arteries: PA peak pressure: 42 mm Hg (S). Consultations:  Oncologist-peter ennever  Urologist-brian budzyn  Interventional radiology   Discharge Exam: Vitals:   05/23/16 0453 05/23/16 0750  BP: 118/77 (!) 128/47  Pulse: 74 73  Resp: 19 18  Temp: 98.1 F (36.7  C) 97.8 F (36.6 C)    General: Eomi, ncat Cardiovascular: s1 s2 no m/r/g Respiratory: clear CTA B Abd soft nt nd  Discharge Instructions    Current Discharge Medication List    START taking these medications   Details  allopurinol (ZYLOPRIM) 100 MG tablet Take 1 tablet (100 mg total) by mouth daily. Qty: 30 tablet, Refills: 0   Associated Diagnoses: Diffuse non-Hodgkin's lymphoma (HCC)    famciclovir (FAMVIR) 500 MG tablet Take 1 tablet (500 mg total) by mouth daily. Qty: 30 tablet, Refills: 0   Associated Diagnoses: Diffuse non-Hodgkin's lymphoma (HCC)    ondansetron (ZOFRAN ODT) 4 MG disintegrating tablet Take 1 tablet (4 mg total) by mouth every 8 (eight) hours as needed for nausea or vomiting. Qty: 20 tablet, Refills: 0      CONTINUE these medications which have NOT CHANGED   Details  aspirin EC 81 MG tablet Take 81 mg by mouth daily.    atenolol (TENORMIN) 100 MG tablet Take 100 mg by mouth every evening.    glimepiride (AMARYL) 4 MG tablet Take 4 mg by mouth daily with breakfast.    metFORMIN (GLUCOPHAGE) 500 MG tablet Take 1,000 mg by mouth 2 (two) times daily with a meal.    rosuvastatin (CRESTOR) 20 MG tablet Take 10 mg by mouth See admin instructions. Take on Mondays Wednesday and Friday mornings    valsartan-hydrochlorothiazide (DIOVAN-HCT) 320-25 MG tablet Take 1 tablet by mouth daily.      STOP taking these  medications     pioglitazone (ACTOS) 30 MG tablet        Allergies  Allergen Reactions  . Codeine     Makes me sick    Follow-up Information    Nickie Retort, MD In 1 week.   Specialty:  Urology Why:  Appointment scheduled for 05/27/2016 at 2:15pm to discuss stent placement for blocked kidney. Please arrive at 2:00pm for paperwork. Thank you.  Contact information: [place] [id number] [phone]            The results of significant diagnostics from this hospitalization (including imaging, microbiology,  ancillary and laboratory) are listed below for reference.    Significant Diagnostic Studies: Ct Chest Wo Contrast  Result Date: 05/17/2016 CLINICAL DATA:  Evaluate adenopathy.  Chronic fatigue. EXAM: CT CHEST WITHOUT CONTRAST TECHNIQUE: Multidetector CT imaging of the chest was performed following the standard protocol without IV contrast. COMPARISON:  None. FINDINGS: Cardiovascular: Normal heart size. No pericardial effusion. Aortic atherosclerosis noted. Calcification within the RCA, LAD and left circumflex coronary artery noted. Mediastinum/Nodes: The trachea appears patent and is midline. Normal appearance of the esophagus. No mediastinal or hilar adenopathy. There is no axillary or supraclavicular adenopathy. Lungs/Pleura: Small left pleural effusion is identified. There is no airspace consolidation or atelectasis. No suspicious pulmonary nodule or mass identified. Upper Abdomen: Morphologic features a liver compatible cirrhosis identified. Again noted is a focal area of low attenuation within the right lobe of liver measuring 2.9 cm, image 123 of series 2. Upper abdominal adenopathy is again noted as described on CT from 05/16/2016. See report from that date for more detailed description of changes in the upper abdomen. Musculoskeletal: No aggressive lytic or sclerotic bone lesions identified within the upper abdomen. IMPRESSION: 1. No evidence for thoracic adenopathy. 2. Aortic atherosclerosis and multi vessel coronary artery calcification. 3. Small left pleural effusion. 4. See report from CT abdomen pelvis dated 05/16/2016 regarding details of the upper abdomen. Electronically Signed   By: Kerby Moors M.D.   On: 05/17/2016 18:10   Ct Abdomen W Contrast  Result Date: 05/16/2016 CLINICAL DATA:  Nausea and vomiting for several weeks. EXAM: CT ABDOMEN WITH CONTRAST TECHNIQUE: Multidetector CT imaging of the abdomen was performed using the standard protocol following bolus administration of  intravenous contrast. CONTRAST:  166mL ISOVUE-300 IOPAMIDOL (ISOVUE-300) INJECTION 61% COMPARISON:  None. FINDINGS: Lower chest: There is a small left pleural effusion identified. The lung bases appear clear. Hepatobiliary: The liver appears cirrhotic. The contour the liver is diffusely nodular and there is hypertrophy of the caudate lobe lateral segment of left lobe. Indeterminate structure in the right lobe of liver measures 3.5 x 1.6 cm and 46 HU. The gallbladder appears normal. No biliary dilatation. Pancreas: No inflammation or mass identified. Spleen: The spleen appears normal. The spleen measures 10 cm in length. Adrenals/Urinary Tract: Normal appearance of the adrenal glands. The kidneys half an irregular contour bilaterally suggestive of chronic scarring. There is left-sided hydronephrosis. Stomach/Bowel: The stomach appears normal. The small bowel loops have a normal course and caliber. No pathologic dilatation of the colon. Vascular/Lymphatic: Aortic atherosclerosis is noted. No aneurysm. Extensive retroperitoneal adenopathy is identified. Nodal mass within the retroperitoneum measures 9.3 x 5.0 cm, image 35 of series 3. There are numerous soft tissue nodules identified within the left-sided perinephric space. Other: Mild soft tissue nodularity within the perineum is identified along with ascites. In the setting of cirrhosis the ascites is nonspecific. Musculoskeletal: No aggressive lytic or sclerotic  bone lesions. IMPRESSION: 1. Extensive adenopathy is identified within the upper abdomen which is worrisome for either a metastatic adenopathy versus lymphoproliferative disorder such as lymphoma. Further assessment with PET-CT and tissue sampling is recommended. 2. There is evidence of soft tissue nodularity within the peritoneum as well as within the left perinephric fat. 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 Ascites 4. Left-sided obstructive uropathy. Favor obstruction of the left  ureter secondary to retroperitoneal adenopathy. 5. Morphologic features of the liver compatible with cirrhosis. 6. Indeterminate intermediate attenuating structure within right lobe of liver. A more definitive assessment of this structure could be obtained with a contrast enhanced MR of the liver. 7. Aortic atherosclerosis.  No aneurysm. Electronically Signed   By: Kerby Moors M.D.   On: 05/16/2016 13:23   Ct Guided Needle Placement  Result Date: 05/19/2016 CLINICAL DATA:  Left-sided retroperitoneal mass involving the psoas muscle with associated para-aortic tumor extension versus lymphadenopathy. EXAM: CT GUIDED CORE BIOPSY OF RETROPERITONEAL ABDOMINAL MASS ANESTHESIA/SEDATION: 1.0  Mg IV Versed; 50 mcg IV Fentanyl Total Moderate Sedation Time: 15 minutes. PROCEDURE: The procedure risks, benefits, and alternatives were explained to the patient. Questions regarding the procedure were encouraged and answered. The patient understands and consents to the procedure. The left translumbar region was prepped with chlorhexidine in a sterile fashion, and a sterile drape was applied covering the operative field. A sterile gown and sterile gloves were used for the procedure. Local anesthesia was provided with 1% Lidocaine. CT was performed through the abdomen in a prone position. Under CT guidance, a 17 gauge needle was advanced to the level of the left psoas muscle as well as adjacent retroperitoneal lymphadenopathy. A total of 4 separate 18 gauge core biopsy samples were obtained and submitted in both formalin and saline. After the procedure the outer needle was removed and additional CT images performed. COMPLICATIONS: None FINDINGS: Again noted is mass enlargement of the left psoas muscle with adjacent retroperitoneal lymphadenopathy. Core biopsy samples were made of the infiltrated psoas muscle itself as well as lymph nodes just beyond the anterior margin of the muscle. Solid tissue was obtained. IMPRESSION:  CT-guided core biopsy performed of left retroperitoneal mass involving the psoas muscle and adjacent retroperitoneal lymphadenopathy. Electronically Signed   By: Aletta Edouard M.D.   On: 05/19/2016 14:45   Mr Liver W Wo Contrast  Result Date: 05/18/2016 CLINICAL DATA:  75 year old female with nausea, diarrhea more days. Abdominal adenopathy. Indeterminate renal lesion EXAM: MRI ABDOMEN WITHOUT AND WITH CONTRAST TECHNIQUE: Multiplanar multisequence MR imaging of the abdomen was performed both before and after the administration of intravenous contrast. CONTRAST:  65mL MULTIHANCE GADOBENATE DIMEGLUMINE 529 MG/ML IV SOLN COMPARISON:  CT abdomen 05/16/2016 FINDINGS: Lower chest:  Small LEFT Hepatobiliary: The liver has a nodular contour with cirrhosis. There is enlargement of the caudate lobe and some contraction in the RIGHT hepatic lobe. Geographic lesion central RIGHT hepatic lobe measures 2.59 x 2.2 cm has high signal intensity T2 weighted imaging. This lesion has no post-contrast enhancement is felt to represent benign focus of scar inflammation. Pancreas: Normal pancreatic parenchymal intensity. No ductal dilatation or inflammation. Spleen: Normal spleen. Adrenals/urinary tract: Nodularity the perinephric space. Hydronephrosis on the LEFT secondary to entrapment of the LEFT ureter. Potential stone within the ureter on comparison CT Stomach/Bowel: Stomach and limited of the small bowel is unremarkable Vascular/Lymphatic: Abdominal aorta is normal caliber. There is extensive lymphadenopathy surrounding the aorta from the level of the adrenal glands to the bifurcation. This  masslike adenopathy measures 6.35 by 0.4 cm axial dimension on 75, series 1201. There is adjacent involvement of the LEFT psoas muscle which is expanded and enhancing measuring 5.8 x 5.4 cm (image 79, series 120). This compares to the small 2.1 x 4.1 cm normal RIGHT psoas muscle There is enhancing nodularity the LEFT perinephric space inferior  to the LEFT kidney. Musculoskeletal: Malignant lesion of the LEFT psoas muscle as described above. No aggressive osseous lesion identified. 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 2. Adjacent bulky periaortic adenopathy near completely surrounding the aorta. Findings are more suggestive of metastatic adenopathy rather than retroperitoneal fibrosis. 3. 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 4. Cirrhotic liver with benign-appearing lesion in RIGHT hepatic. 5. Hydronephrosis on the LEFT secondary to entrapment of the LEFT ureter by the the retroperitoneal process. Potential stone within the ureter additionally. Electronically Signed   By: Suzy Bouchard M.D.   On: 05/18/2016 08:40   Ir Fluoro Guide Cv Line Right  Result Date: 05/22/2016 INDICATION: 75 year old female with a history of large cell lymphoma. EXAM: IMPLANTED PORT A CATH PLACEMENT WITH ULTRASOUND AND FLUOROSCOPIC GUIDANCE MEDICATIONS: 2.0 g Ancef; The antibiotic was administered within an appropriate time interval prior to skin puncture. ANESTHESIA/SEDATION: Moderate (conscious) sedation was employed during this procedure. A total of Versed 2.0 mg and Fentanyl 100 mcg was administered intravenously. Moderate Sedation Time: 15 minutes. The patient's level of consciousness and vital signs were monitored continuously by radiology nursing throughout the procedure under my direct supervision. FLUOROSCOPY TIME:  Zero minutes, 6 seconds COMPLICATIONS: None PROCEDURE: The procedure, risks, benefits, and alternatives were explained to the patient. Questions regarding the procedure were encouraged and answered. The patient understands and consents to the procedure. Ultrasound survey was performed with images stored and sent to PACs. The right neck and chest was prepped with chlorhexidine, and draped in the usual sterile fashion using maximum  barrier technique (cap and mask, sterile gown, sterile gloves, large sterile sheet, hand hygiene and cutaneous antiseptic). Antibiotic prophylaxis was provided with 2.0g Ancef administered IV one hour prior to skin incision. Local anesthesia was attained by infiltration with 1% lidocaine without epinephrine. Ultrasound demonstrated patency of the right internal jugular vein, and this was documented with an image. Under real-time ultrasound guidance, this vein was accessed with a 21 gauge micropuncture needle and image documentation was performed. A small dermatotomy was made at the access site with an 11 scalpel. A 0.018" wire was advanced into the SVC and used to estimate the length of the internal catheter. The access needle exchanged for a 37F micropuncture vascular sheath. The 0.018" wire was then removed and a 0.035" wire advanced into the IVC. An appropriate location for the subcutaneous reservoir was selected below the clavicle and an incision was made through the skin and underlying soft tissues. The subcutaneous tissues were then dissected using a combination of blunt and sharp surgical technique and a pocket was formed. A single lumen power injectable portacatheter was then tunneled through the subcutaneous tissues from the pocket to the dermatotomy and the port reservoir placed within the subcutaneous pocket. The venous access site was then serially dilated and a peel away vascular sheath placed over the wire. The wire was removed and the port catheter advanced into position under fluoroscopic guidance. The catheter tip is positioned in the cavoatrial junction. This was documented with a spot image. The portacatheter was then tested and found to  flush and aspirate well. The port was flushed with saline followed by 100 units/mL heparinized saline. The pocket was then closed in two layers using first subdermal inverted interrupted absorbable sutures followed by a running subcuticular suture. The epidermis  was then sealed with Dermabond. The dermatotomy at the venous access site was also seal with Dermabond. Patient tolerated the procedure well and remained hemodynamically stable throughout. No complications encountered and no significant blood loss encountered IMPRESSION: Status post right IJ port catheter placement. Catheter ready for use. Signed, Dulcy Fanny. Earleen Newport, DO [facility] Radiology Electronically Signed   By: Corrie Mckusick D.O.   On: 05/22/2016 17:11   Ir US Guide Vasc Access Right  Result Date: 05/22/2016 INDICATION: 75 year old female with a history of large cell lymphoma. EXAM: IMPLANTED PORT A CATH PLACEMENT WITH ULTRASOUND AND FLUOROSCOPIC GUIDANCE MEDICATIONS: 2.0 g Ancef; The antibiotic was administered within an appropriate time interval prior to skin puncture. ANESTHESIA/SEDATION: Moderate (conscious) sedation was employed during this procedure. A total of Versed 2.0 mg and Fentanyl 100 mcg was administered intravenously. Moderate Sedation Time: 15 minutes. The patient's level of consciousness and vital signs were monitored continuously by radiology nursing throughout the procedure under my direct supervision. FLUOROSCOPY TIME:  Zero minutes, 6 seconds COMPLICATIONS: None PROCEDURE: The procedure, risks, benefits, and alternatives were explained to the patient. Questions regarding the procedure were encouraged and answered. The patient understands and consents to the procedure. Ultrasound survey was performed with images stored and sent to PACs. The right neck and chest was prepped with chlorhexidine, and draped in the usual sterile fashion using maximum barrier technique (cap and mask, sterile gown, sterile gloves, large sterile sheet, hand hygiene and cutaneous antiseptic). Antibiotic prophylaxis was provided with 2.0g Ancef administered IV one hour prior to skin incision. Local anesthesia was attained by infiltration with 1% lidocaine without  epinephrine. Ultrasound demonstrated patency of the right internal jugular vein, and this was documented with an image. Under real-time ultrasound guidance, this vein was accessed with a 21 gauge micropuncture needle and image documentation was performed. A small dermatotomy was made at the access site with an 11 scalpel. A 0.018" wire was advanced into the SVC and used to estimate the length of the internal catheter. The access needle exchanged for a 55F micropuncture vascular sheath. The 0.018" wire was then removed and a 0.035" wire advanced into the IVC. An appropriate location for the subcutaneous reservoir was selected below the clavicle and an incision was made through the skin and underlying soft tissues. The subcutaneous tissues were then dissected using a combination of blunt and sharp surgical technique and a pocket was formed. A single lumen power injectable portacatheter was then tunneled through the subcutaneous tissues from the pocket to the dermatotomy and the port reservoir placed within the subcutaneous pocket. The venous access site was then serially dilated and a peel away vascular sheath placed over the wire. The wire was removed and the port catheter advanced into position under fluoroscopic guidance. The catheter tip is positioned in the cavoatrial junction. This was documented with a spot image. The portacatheter was then tested and found to flush and aspirate well. The port was flushed with saline followed by 100 units/mL heparinized saline. The pocket was then closed in two layers using first subdermal inverted interrupted absorbable sutures followed by a running subcuticular suture. The epidermis was then sealed with Dermabond. The dermatotomy at the venous access site was also seal with Dermabond. Patient tolerated the procedure  well and remained hemodynamically stable throughout. No complications encountered and no significant blood loss encountered IMPRESSION: Status post right IJ port  catheter placement. Catheter ready for use. Signed, Dulcy Fanny. Earleen Newport, DO [facility] Radiology Electronically Signed   By: Corrie Mckusick D.O.   On: 05/22/2016 17:11   Dg Chest Port 1 View  Result Date: 05/19/2016 CLINICAL DATA:  Shortness of breath EXAM: PORTABLE CHEST 1 VIEW COMPARISON:  Multiple exams, including 05/17/2016 FINDINGS: Atherosclerotic aortic arch. Mild blunting of the left lateral costophrenic angle. Heart size within normal limits for projection. The lungs appear otherwise clear. IMPRESSION: 1. Small left pleural effusion causing blunting of the left lateral costophrenic angle. 2. Atherosclerotic aortic arch. Electronically Signed   By: Van Clines M.D.   On: 05/19/2016 10:25   Dg Chest Port 1 View  Result Date: 05/17/2016 CLINICAL DATA:  Shortness of breath EXAM: PORTABLE CHEST 1 VIEW COMPARISON:  11/22/2009 FINDINGS: The heart size appears normal. There is aortic atherosclerosis noted. Chronic asymmetric elevation of right hemidiaphragm noted. No pleural effusion or edema. IMPRESSION: 1. No acute cardiopulmonary abnormalities. 2. Aortic atherosclerosis. Electronically Signed   By: Kerby Moors M.D.   On: 05/17/2016 12:11    Microbiology: Recent Results (from the past 240 hour(s))  Blood culture (routine x 2)     Status: None   Collection Time: 05/16/16  5:20 PM  Result Value Ref Range Status   Specimen Description BLOOD RIGHT HAND  Final   Special Requests BOTTLES DRAWN AEROBIC AND ANAEROBIC 5CC EA  Final   Culture NO GROWTH 5 DAYS  Final   Report Status 05/21/2016 FINAL  Final  Blood culture (routine x 2)     Status: None   Collection Time: 05/16/16  5:40 PM  Result Value Ref Range Status   Specimen Description BLOOD LEFT HAND  Final   Special Requests IN PEDIATRIC BOTTLE 2CC  Final   Culture NO GROWTH 5 DAYS  Final   Report Status 05/21/2016 FINAL  Final  Urine culture     Status: None   Collection Time:  05/16/16  7:04 PM  Result Value Ref Range Status   Specimen Description URINE, RANDOM  Final   Special Requests NONE  Final   Culture NO GROWTH  Final   Report Status 05/18/2016 FINAL  Final     Labs: Basic Metabolic Panel:  Recent Labs Lab 05/18/16 0520 05/19/16 0521 05/20/16 0802 05/21/16 0416 05/22/16 0345  NA 139 139 137 136 135  K 3.9 4.0 4.0 3.9 3.8  CL 109 106 103 100* 100*  CO2 23 24 24 22 25   GLUCOSE 144* 127* 135* 145* 155*  BUN 29* 19 18 21* 22*  CREATININE 1.23* 1.25* 1.09* 1.14* 1.16*  CALCIUM 11.3* 10.1 10.2 10.4* 10.6*   Liver Function Tests:  Recent Labs Lab 05/18/16 0520 05/19/16 0521 05/20/16 0802 05/21/16 0416 05/22/16 0345  AST 30 29 32 31 28  ALT 15 15 17 16 16   ALKPHOS 65 63 67 64 69  BILITOT 0.6 0.4 0.7 0.7 0.7  PROT 6.3* 6.0* 6.4* 5.8* 5.9*  ALBUMIN 3.0* 2.9* 3.0* 2.8* 2.8*   No results for input(s): LIPASE, AMYLASE in the last 168 hours.  Recent Labs Lab 05/16/16 1740  AMMONIA 46*   CBC:  Recent Labs Lab 05/19/16 0521 05/20/16 0802 05/21/16 0416 05/22/16 0345 05/23/16 0623  WBC 5.0 6.5 5.6 6.0 6.6  NEUTROABS 3.6 4.9 4.1 4.7 5.1  HGB 10.7* 11.1* 10.8* 10.6* 10.7*  HCT 33.9* 34.5* 33.3* 33.0* 33.8*  MCV 85.6 84.6 82.8 83.5 84.1  PLT 201 225 164 201 224   Cardiac Enzymes: No results for input(s): CKTOTAL, CKMB, CKMBINDEX, TROPONINI in the last 168 hours. BNP: BNP (last 3 results) No results for input(s): BNP in the last 8760 hours.  ProBNP (last 3 results) No results for input(s): PROBNP in the last 8760 hours.  CBG:  Recent Labs Lab 05/22/16 0743 05/22/16 1134 05/22/16 1610 05/22/16 2017 05/23/16 0802  GLUCAP 161* 227* 192* 183* 156*       Signed:  Nita Sells MD   [facility] 05/23/2016, 11:09 AM

## 2016-05-23 NOTE — Progress Notes (Signed)
  Echocardiogram 2D Echocardiogram has been performed.  Jennette Dubin 05/23/2016, 9:51 AM

## 2016-05-24 NOTE — Progress Notes (Signed)
Daughter called concerned that patient had 5 loose bowel movements since 5 pm.  She wanted to know if the patient should take a old medication called Bentyl for diarrhea.  I referred daughter to patient's primary doctor, Dr. Alroy Dust with [facility].  I advised once patient discharged from the hospital, the patient should call the office of her PCP and they will have an on call provider to answer questions.  I also advised that if she felt like her mother needed immediate help to call 911 or take to ED.  [employer] RN-BC, WTA.

## 2016-05-25 LAB — PTH-RELATED PEPTIDE: PTH-related peptide: <1.1 pmol/L

## 2016-05-28 ENCOUNTER — Telehealth: Payer: Self-pay | Admitting: *Deleted

## 2016-05-28 NOTE — Telephone Encounter (Signed)
Request made for pathology slides be sent to  [facility] Dept of Pathology Attn Dr. Worthy Keeler or Dr. Dwain Sarna Hickman [address].  [place], [place] [address].  Fed Ex # [id number]

## 2016-11-07 ENCOUNTER — Encounter (HOSPITAL_COMMUNITY): Payer: Self-pay

## 2017-01-20 DEATH — deceased

## 2017-09-06 IMAGING — MR MR ABDOMEN WO/W CM
14 of 16 series · 40 of 48 positions shown · IV contrast (multihance)
Comparison: CT abdomen 05/16/2016

CLINICAL DATA: 75-year-old male with nausea, diarrhea more days.
Abdominal adenopathy. Indeterminate renal lesion

EXAM:
MRI ABDOMEN WITHOUT AND WITH CONTRAST
TECHNIQUE: Multiplanar multisequence MR imaging of the abdomen was performed
both before and after the administration of intravenous contrast.
CONTRAST:  8mL MULTIHANCE GADOBENATE DIMEGLUMINE 529 MG/ML IV SOLN

[Series 5: T2 · coronal · 6.0mm · 0.78mm/px · 1 of 38 slices shown (1 of 2)]
[im 1/38]
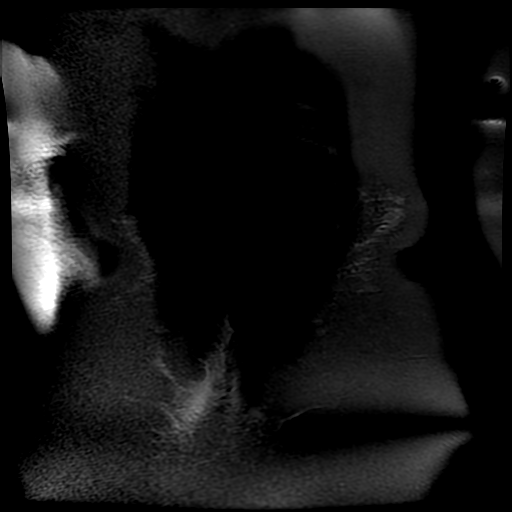

[Series 7: T2 fat-sat · axial · 6.0mm · 0.74mm/px · 1 of 36 slices shown]
[im 1/36]
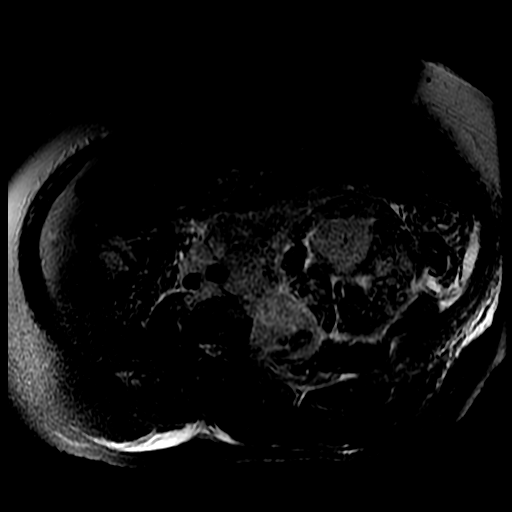

[Series 8: T2 · axial · 5.0mm · 1.48mm/px · 1 of 42 slices shown (2 of 2)]
[im 1/42]
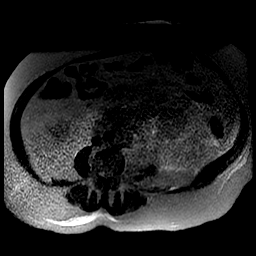

[Series 9: DWI b500 · axial · 6.0mm · 1.48mm/px · 1 of 51 slices shown]
[im 1/51]
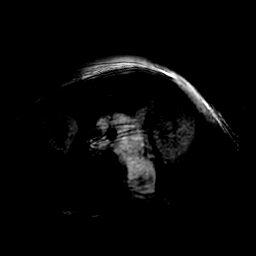

[Series 11: ax dualecho · axial · 6.0mm · 0.78mm/px · z∈[-75,+170]mm · 3 of 72 slices shown]
[im 1/72]
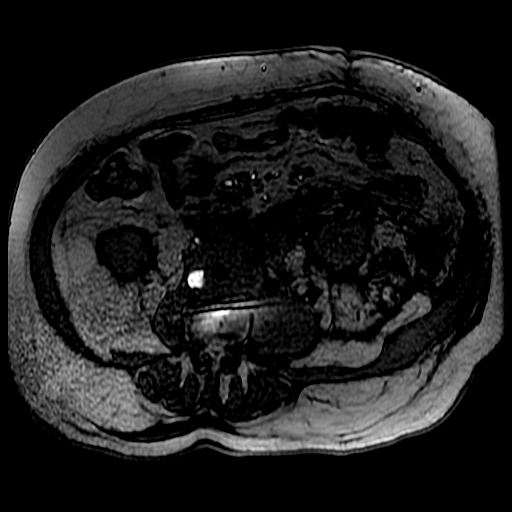
[im 36/72]
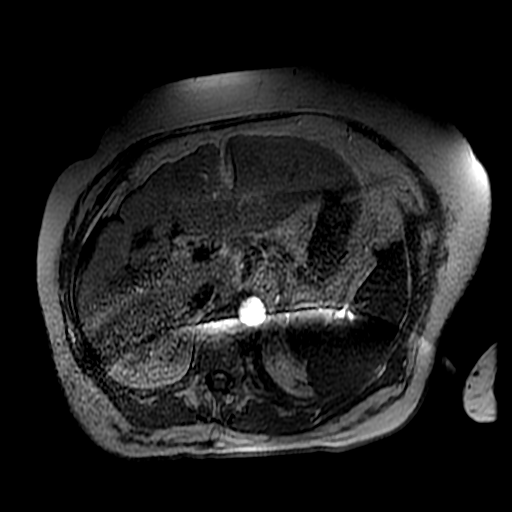
[im 72/72]
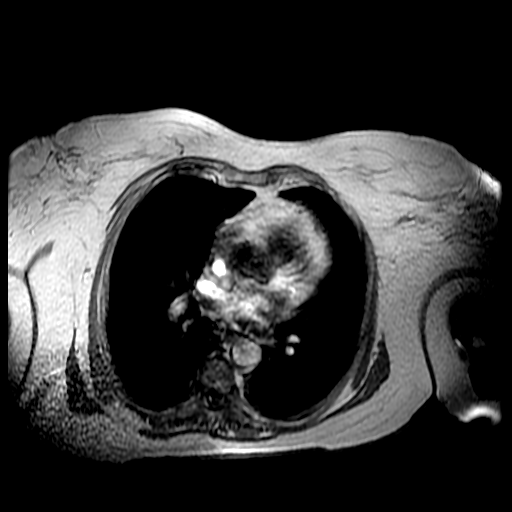

[Series 13: T1 dynamic post-contrast · coronal · 6.0mm · 0.78mm/px · 4 of 92 slices shown]
[im 1/92]
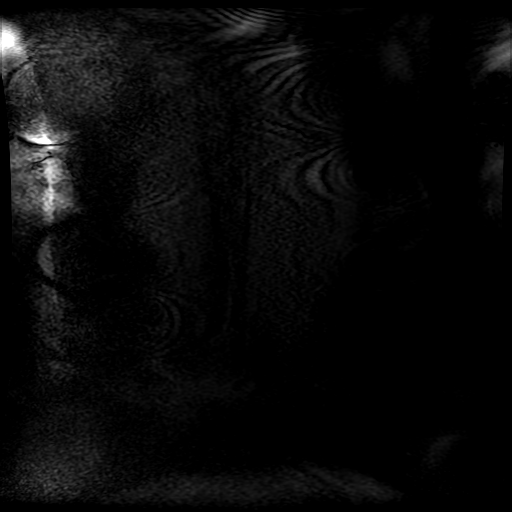
[im 31/92]
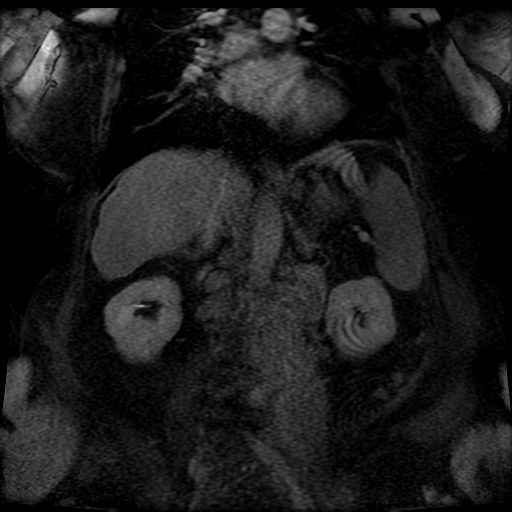
[im 61/92]
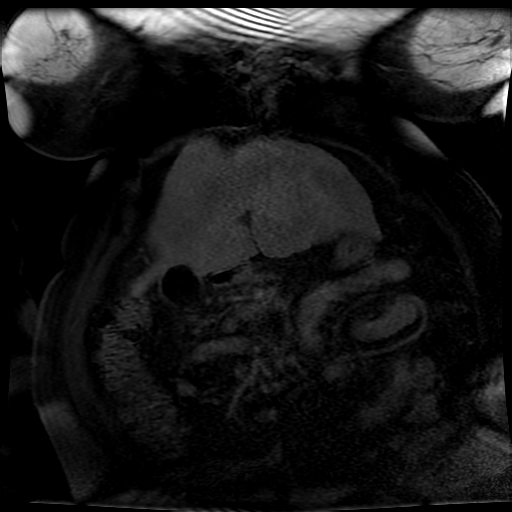
[im 92/92]
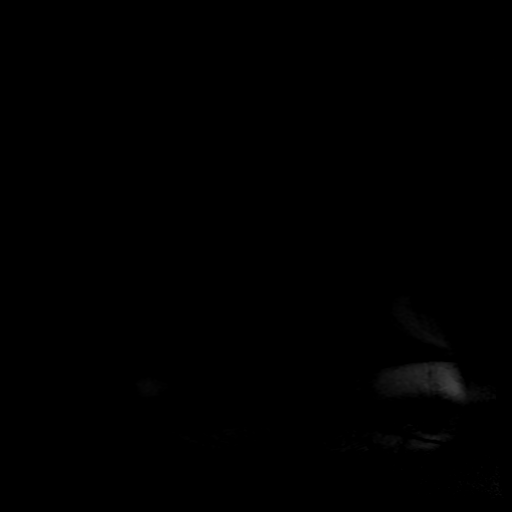

[Series 900: DWI · axial · 6.0mm · 1.48mm/px · 1 of 27 slices shown]
[im 1/27]
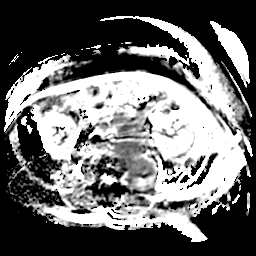

[Series 1200: T1 dynamic · axial · 6.0mm · 1.52mm/px · z∈[-99,+174]mm · 4 of 92 slices shown (1 of 5)]
[im 1/92]
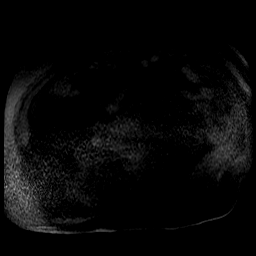
[im 31/92]
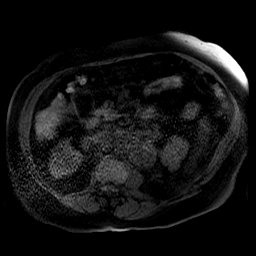
[im 61/92]
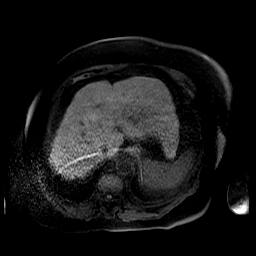
[im 92/92]
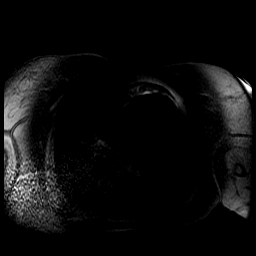

[Series 1201: T1 dynamic · axial · 6.0mm · 1.52mm/px · z∈[-99,+174]mm · 4 of 92 slices shown (2 of 5)]
[im 1/92]
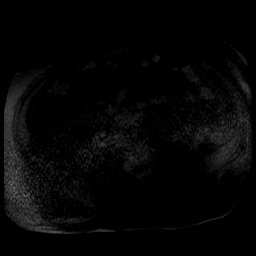
[im 31/92]
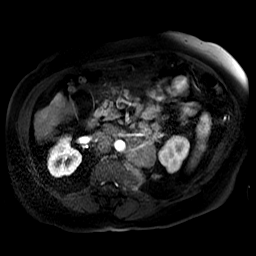
[im 61/92]
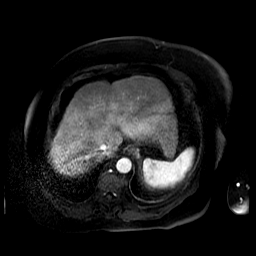
[im 92/92]
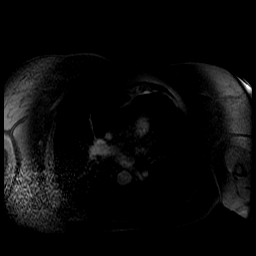

[Series 1202: T1 dynamic · axial · 6.0mm · 1.52mm/px · z∈[-99,+174]mm · 4 of 92 slices shown (3 of 5)]
[im 1/92]
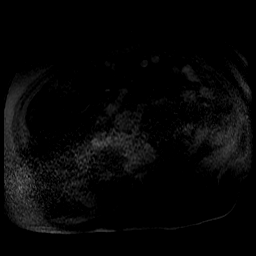
[im 31/92]
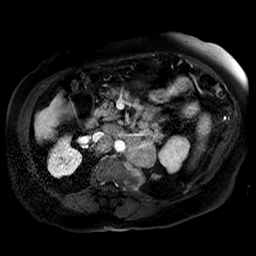
[im 61/92]
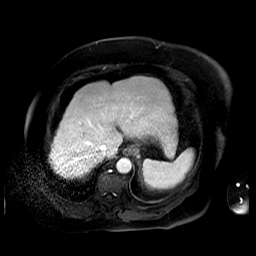
[im 92/92]
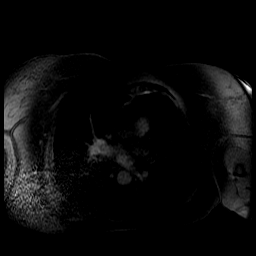

[Series 1203: T1 dynamic · axial · 6.0mm · 1.52mm/px · z∈[-99,+174]mm · 4 of 92 slices shown (4 of 5)]
[im 1/92]
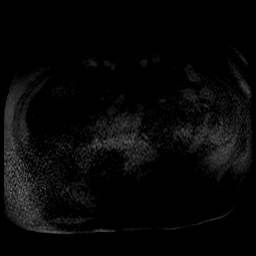
[im 31/92]
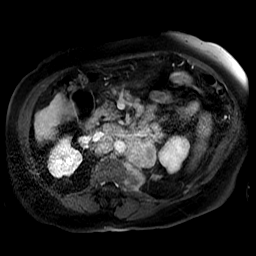
[im 61/92]
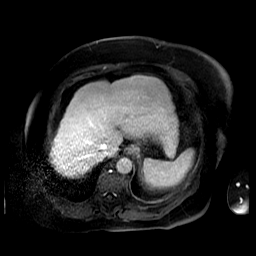
[im 92/92]
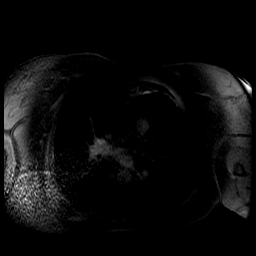

[Series 1204: T1 dynamic · axial · 6.0mm · 1.52mm/px · z∈[-99,+174]mm · 4 of 92 slices shown (5 of 5)]
[im 1/92]
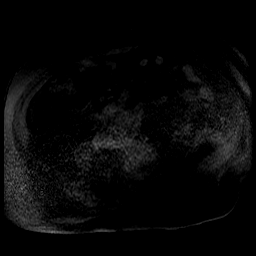
[im 31/92]
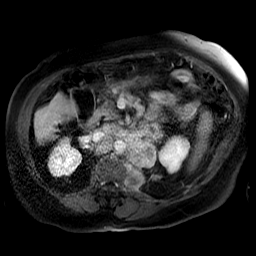
[im 61/92]
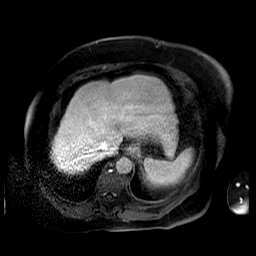
[im 92/92]
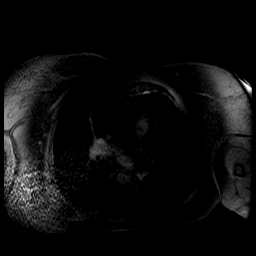

[((id)/(id)/1)-((id)/(id)/1) · axial · 6.0mm · 1.52mm/px · z∈[-99,+174]mm · 4 of 92 slices shown (1 of 2)]
[im 1/92]
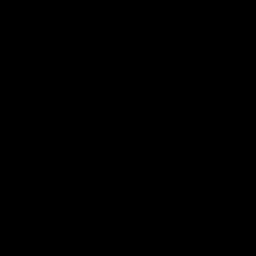
[im 31/92]
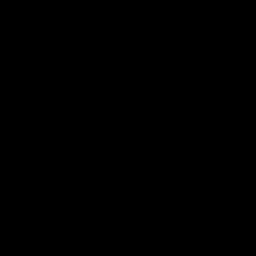
[im 61/92]
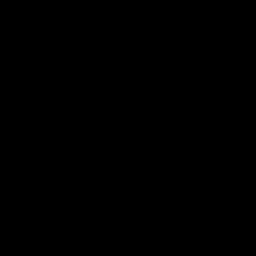
[im 92/92]
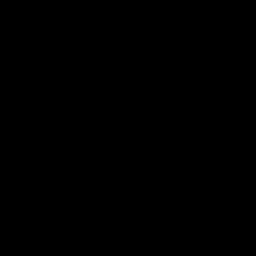

[((id)/(id)/1)-((id)/(id)/1) · axial · 6.0mm · 1.52mm/px · z∈[-99,+174]mm · 4 of 92 slices shown (2 of 2)]
[im 1/92]
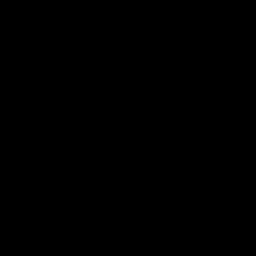
[im 31/92]
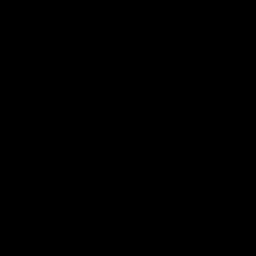
[im 61/92]
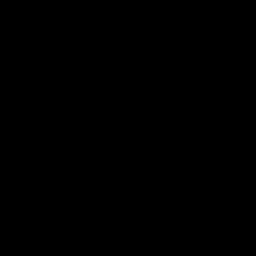
[im 92/92]
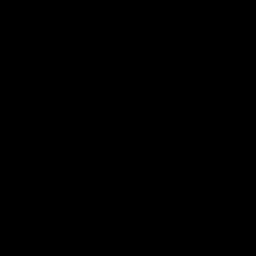

[40 of 48 positions shown; findings below may reference images not displayed]

FINDINGS: Lower chest:  Small LEFT

Hepatobiliary: The liver has a nodular contour with cirrhosis. There
is enlargement of the caudate lobe and some contraction in the RIGHT
hepatic lobe. Geographic lesion central RIGHT hepatic lobe measures
2.59 x 2.2 cm has high signal intensity T2 weighted imaging. This
lesion has no post-contrast enhancement is felt to represent benign
focus of scar inflammation.

Pancreas: Normal pancreatic parenchymal intensity. No ductal
dilatation or inflammation.

Spleen: Normal spleen.

Adrenals/urinary tract: Nodularity the perinephric space.
Hydronephrosis on the LEFT secondary to entrapment of the LEFT
ureter. Potential stone within the ureter on comparison CT

Stomach/Bowel: Stomach and limited of the small bowel is
unremarkable

Vascular/Lymphatic: Abdominal aorta is normal caliber.

There is extensive lymphadenopathy surrounding the aorta from the
level of the adrenal glands to the bifurcation. This masslike
adenopathy measures 6.35 by 0.4 cm axial dimension on 75, series
9279.

There is adjacent involvement of the LEFT psoas muscle which is
expanded and enhancing measuring 5.8 x 5.4 cm (image 79, series
120). This compares to the small 2.1 x 4.1 cm normal RIGHT psoas
muscle

There is enhancing nodularity the LEFT perinephric space inferior to
the LEFT kidney.

Musculoskeletal: Malignant lesion of the LEFT psoas muscle as
described above. No aggressive osseous lesion identified.
IMPRESSION: 1. Malignant infiltration of the LEFT psoas muscle. Findings are
concerning for SARCOMA, lymphoma, or urothelial carcinoma.
2. Adjacent bulky periaortic adenopathy near completely surrounding
the aorta. Findings are more suggestive of metastatic adenopathy
rather than retroperitoneal fibrosis.
3. Nodular metastatic disease within the LEFT para renal space.
4. Cirrhotic liver with benign-appearing lesion in RIGHT hepatic.
5. Hydronephrosis on the LEFT secondary to entrapment of the LEFT
ureter by the the retroperitoneal process. Potential stone within
the ureter additionally.

## 2017-09-07 IMAGING — CT CT GUIDANCE NEEDLE PLACEMENT
1 of 2 series · 15 of 32 positions shown, 19 images · non-contrast
Comparison: none

CLINICAL DATA: Left-sided retroperitoneal mass involving the psoas
muscle with associated para-aortic tumor extension versus
lymphadenopathy.

[Series 2: i-spiral 5.0 b40f · axial · 0.93mm/px · z∈[+700,+865]mm · 15 of 53 slices shown, 19 images]
[im 3/53  soft-tissue]
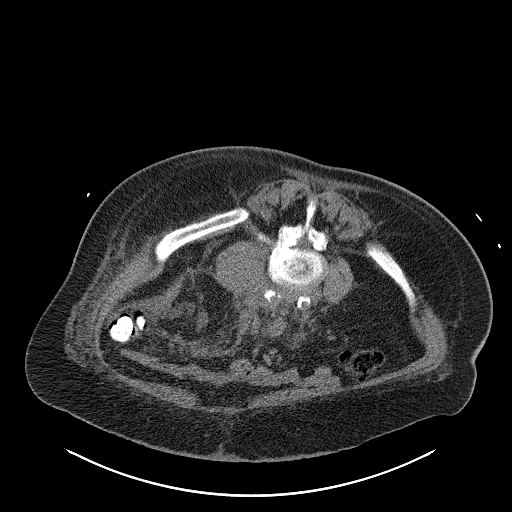
[im 3/53  bone]
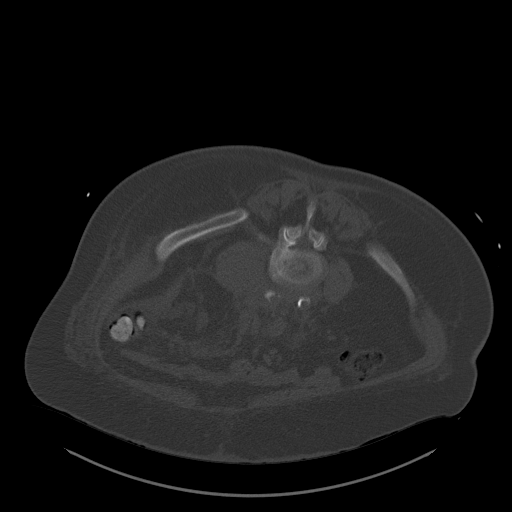
[im 7/53  soft-tissue]
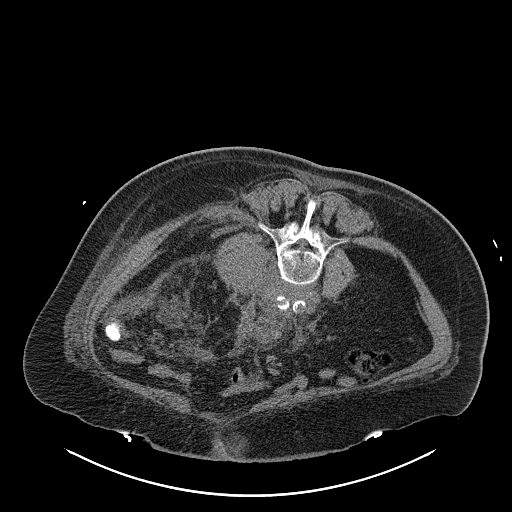
[im 12/53  soft-tissue]
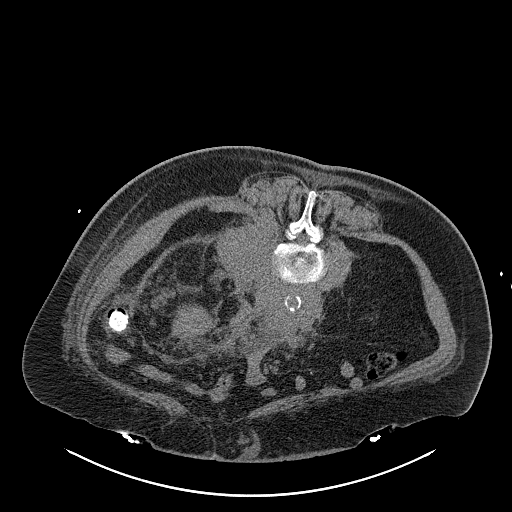
[im 14/53  soft-tissue]
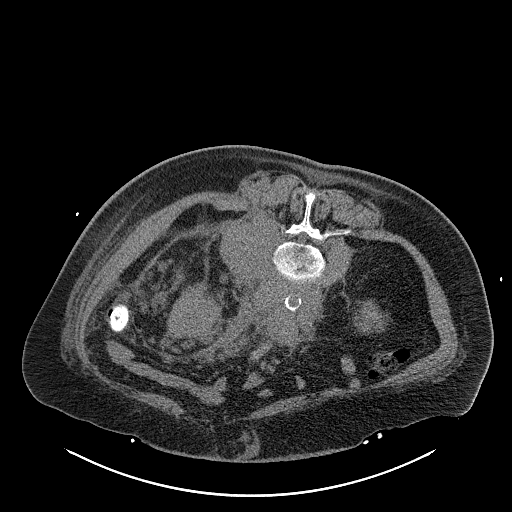
[im 19/53  soft-tissue]
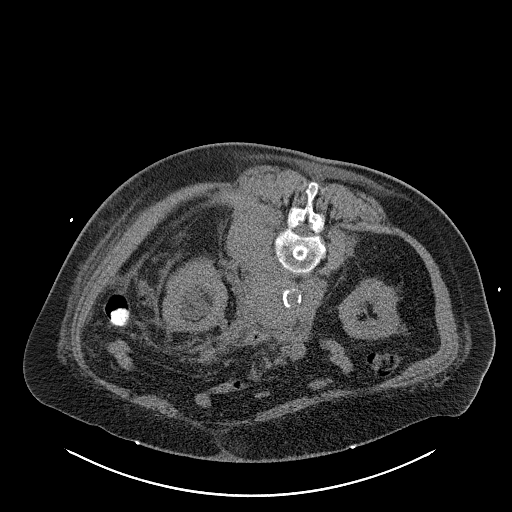
[im 23/53  soft-tissue]
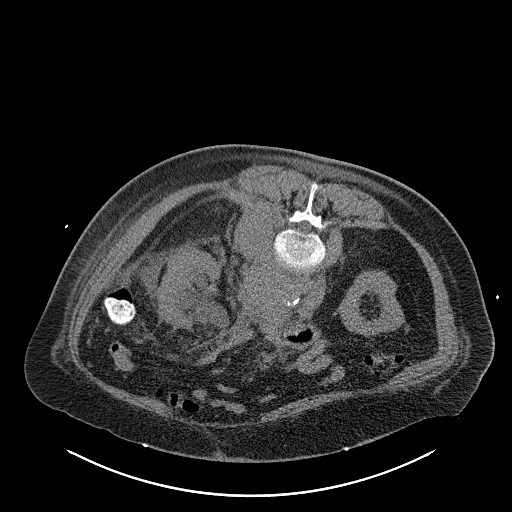
[im 28/53  soft-tissue]
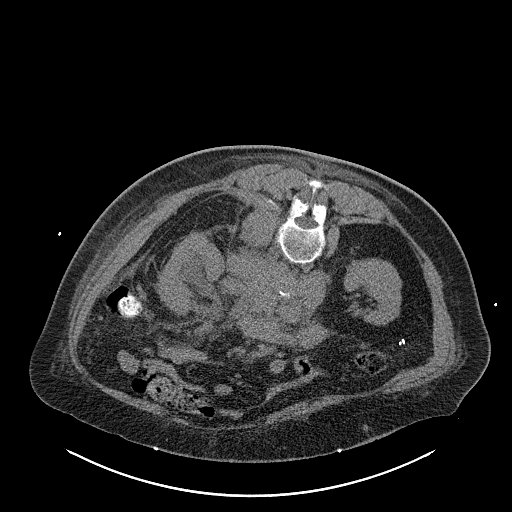
[im 30/53  soft-tissue]
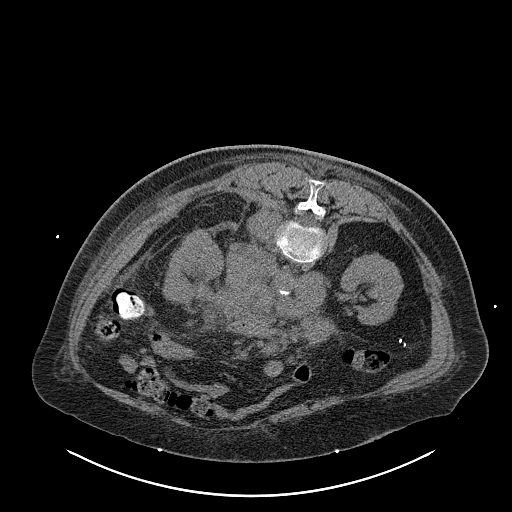
[im 34/53  soft-tissue]
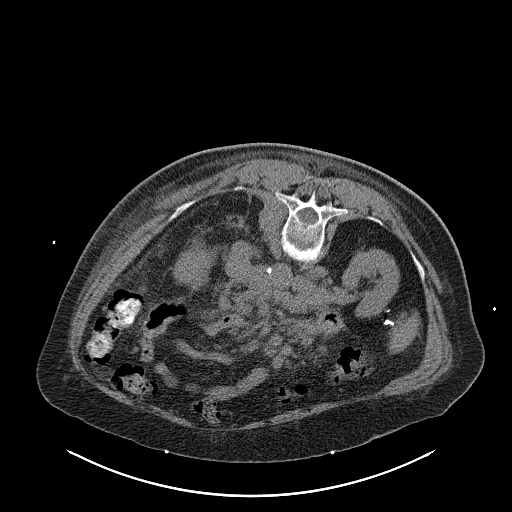
[im 34/53  bone]
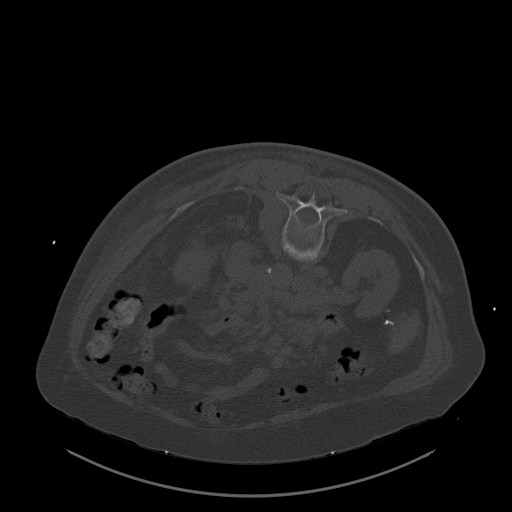
[im 39/53  soft-tissue]
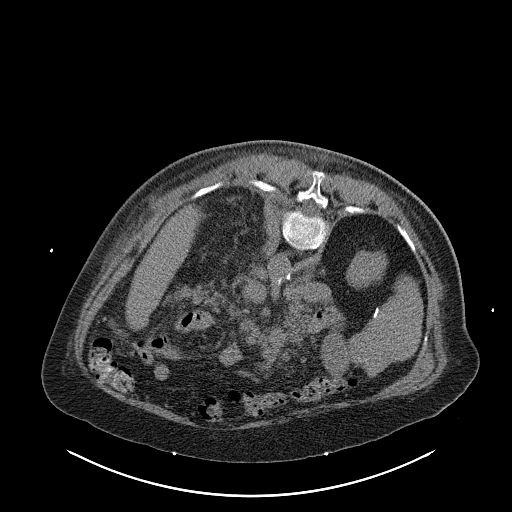
[im 41/53  soft-tissue]
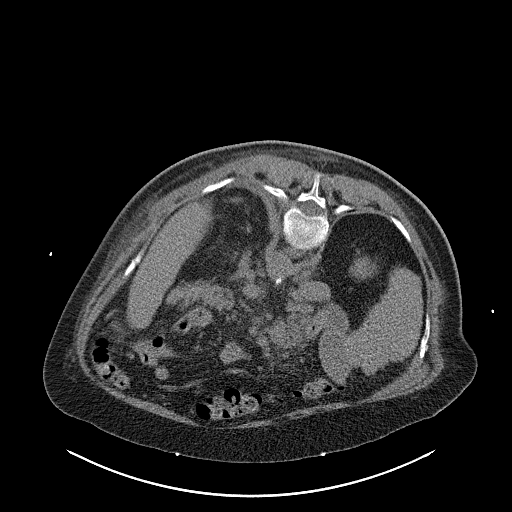
[im 43/53  lung]
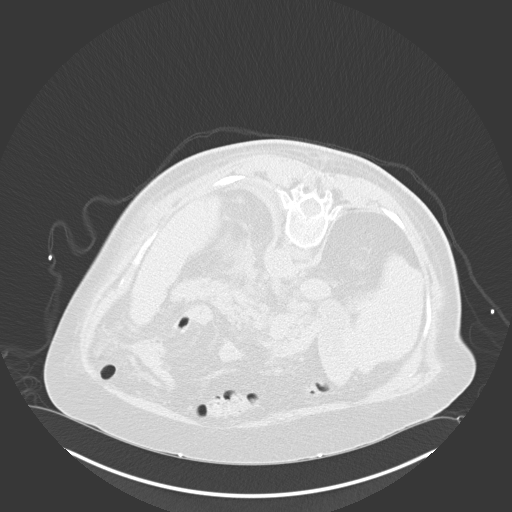
[im 46/53  soft-tissue]
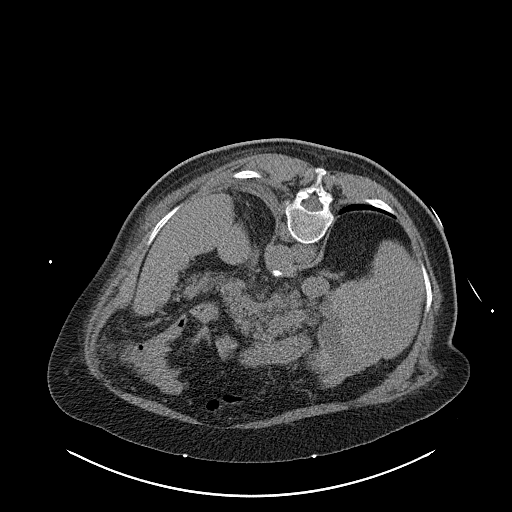
[im 46/53  lung]
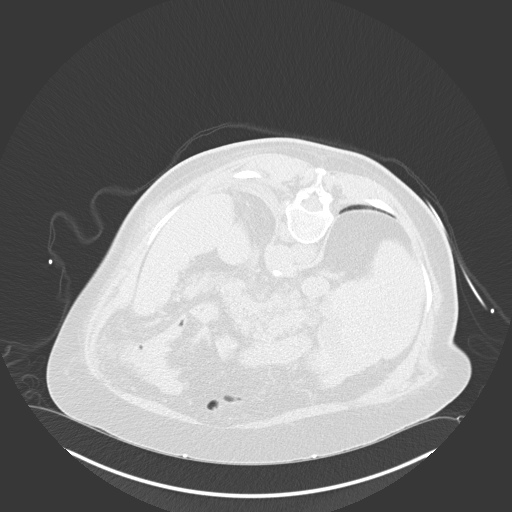
[im 48/53  lung]
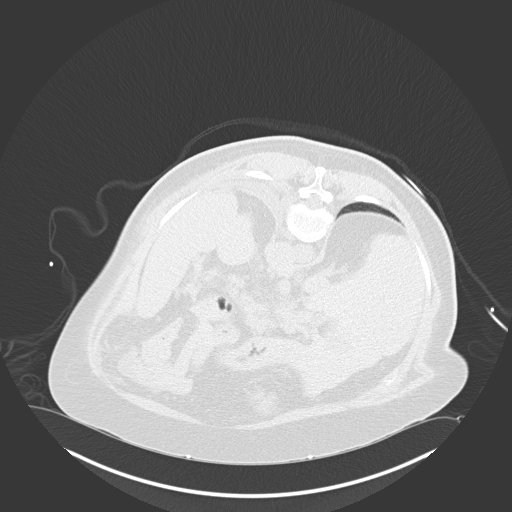
[im 50/53  soft-tissue]
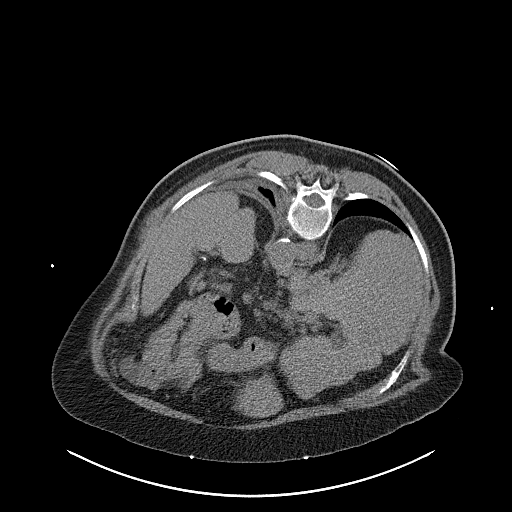
[im 50/53  lung]
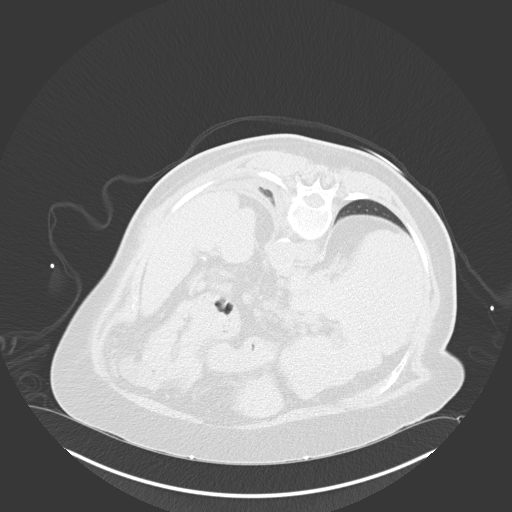

[15 of 32 positions shown; findings below may reference images not displayed]

EXAM:
CT GUIDED CORE BIOPSY OF RETROPERITONEAL ABDOMINAL MASS

ANESTHESIA/SEDATION:
1.0  Mg IV Versed; 50 mcg IV Fentanyl

Total Moderate Sedation Time: 15 minutes.

PROCEDURE:
The procedure risks, benefits, and alternatives were explained to
the patient. Questions regarding the procedure were encouraged and
answered. The patient understands and consents to the procedure.

The left translumbar region was prepped with chlorhexidine in a
sterile fashion, and a sterile drape was applied covering the
operative field. A sterile gown and sterile gloves were used for the
procedure. Local anesthesia was provided with 1% Lidocaine.

CT was performed through the abdomen in a prone position. Under CT
guidance, a 17 gauge needle was advanced to the level of the left
psoas muscle as well as adjacent retroperitoneal lymphadenopathy. A
total of 4 separate 18 gauge core biopsy samples were obtained and
submitted in both formalin and saline. After the procedure the outer
needle was removed and additional CT images performed.

COMPLICATIONS:
None
FINDINGS: Again noted is mass enlargement of the left psoas muscle with
adjacent retroperitoneal lymphadenopathy. Core biopsy samples were
made of the infiltrated psoas muscle itself as well as lymph nodes
just beyond the anterior margin of the muscle. Solid tissue was
obtained.
IMPRESSION: CT-guided core biopsy performed of left retroperitoneal mass
involving the psoas muscle and adjacent retroperitoneal
lymphadenopathy.
